# Patient Record
Sex: Male | Born: 1945 | Race: White | Hispanic: No | Marital: Married | Smoking: Never smoker
Health system: Southern US, Community
[De-identification: ages and names within clinical notes are randomized; demographics above are authoritative.]

## PROBLEM LIST (undated history)

## (undated) DIAGNOSIS — T4145XA Adverse effect of unspecified anesthetic, initial encounter: Secondary | ICD-10-CM

## (undated) DIAGNOSIS — E7800 Pure hypercholesterolemia, unspecified: Secondary | ICD-10-CM

## (undated) DIAGNOSIS — G20 Parkinson's disease: Secondary | ICD-10-CM

## (undated) DIAGNOSIS — K635 Polyp of colon: Secondary | ICD-10-CM

## (undated) DIAGNOSIS — R251 Tremor, unspecified: Secondary | ICD-10-CM

## (undated) DIAGNOSIS — L409 Psoriasis, unspecified: Secondary | ICD-10-CM

## (undated) DIAGNOSIS — F419 Anxiety disorder, unspecified: Secondary | ICD-10-CM

## (undated) DIAGNOSIS — G20A1 Parkinson's disease without dyskinesia, without mention of fluctuations: Secondary | ICD-10-CM

## (undated) DIAGNOSIS — H9190 Unspecified hearing loss, unspecified ear: Secondary | ICD-10-CM

## (undated) DIAGNOSIS — N400 Enlarged prostate without lower urinary tract symptoms: Secondary | ICD-10-CM

## (undated) DIAGNOSIS — N433 Hydrocele, unspecified: Secondary | ICD-10-CM

## (undated) DIAGNOSIS — G4752 REM sleep behavior disorder: Secondary | ICD-10-CM

## (undated) DIAGNOSIS — T8859XA Other complications of anesthesia, initial encounter: Secondary | ICD-10-CM

## (undated) DIAGNOSIS — R269 Unspecified abnormalities of gait and mobility: Secondary | ICD-10-CM

## (undated) HISTORY — DX: Parkinson's disease: G20

## (undated) HISTORY — DX: REM sleep behavior disorder: G47.52

## (undated) HISTORY — DX: Benign prostatic hyperplasia without lower urinary tract symptoms: N40.0

## (undated) HISTORY — DX: Unspecified abnormalities of gait and mobility: R26.9

## (undated) HISTORY — DX: Parkinson's disease without dyskinesia, without mention of fluctuations: G20.A1

## (undated) HISTORY — PX: FOOT SURGERY: SHX648

## (undated) HISTORY — DX: Tremor, unspecified: R25.1

## (undated) HISTORY — DX: Anxiety disorder, unspecified: F41.9

## (undated) HISTORY — PX: EXTERNAL EAR SURGERY: SHX627

## (undated) HISTORY — DX: Pure hypercholesterolemia, unspecified: E78.00

## (undated) HISTORY — DX: Polyp of colon: K63.5

---

## 1965-07-04 HISTORY — PX: OTHER SURGICAL HISTORY: SHX169

## 1976-07-04 HISTORY — PX: MIDDLE EAR SURGERY: SHX713

## 1976-07-04 HISTORY — PX: OTHER SURGICAL HISTORY: SHX169

## 1977-07-04 HISTORY — PX: VASECTOMY: SHX75

## 1985-07-04 HISTORY — PX: APPENDECTOMY: SHX54

## 2003-08-27 ENCOUNTER — Encounter: Admission: RE | Admit: 2003-08-27 | Discharge: 2003-08-27 | Payer: Self-pay | Admitting: Sports Medicine

## 2004-09-01 ENCOUNTER — Encounter (INDEPENDENT_AMBULATORY_CARE_PROVIDER_SITE_OTHER): Payer: Self-pay | Admitting: Specialist

## 2004-09-01 ENCOUNTER — Ambulatory Visit (HOSPITAL_COMMUNITY): Admission: RE | Admit: 2004-09-01 | Discharge: 2004-09-01 | Payer: Self-pay | Admitting: Gastroenterology

## 2004-11-22 ENCOUNTER — Encounter: Admission: RE | Admit: 2004-11-22 | Discharge: 2004-12-07 | Payer: Self-pay | Admitting: Family Medicine

## 2007-03-09 ENCOUNTER — Encounter (INDEPENDENT_AMBULATORY_CARE_PROVIDER_SITE_OTHER): Payer: Self-pay | Admitting: Internal Medicine

## 2007-03-09 ENCOUNTER — Ambulatory Visit: Payer: Self-pay | Admitting: Vascular Surgery

## 2007-03-09 ENCOUNTER — Observation Stay (HOSPITAL_COMMUNITY): Admission: EM | Admit: 2007-03-09 | Discharge: 2007-03-10 | Payer: Self-pay | Admitting: Emergency Medicine

## 2010-04-06 ENCOUNTER — Encounter: Admission: RE | Admit: 2010-04-06 | Discharge: 2010-04-06 | Payer: Self-pay | Admitting: Otolaryngology

## 2010-11-16 NOTE — Discharge Summary (Signed)
NAMEDIXIE, JAFRI              ACCOUNT NO.:  [id number]   MEDICAL RECORD NO.:  [id number]          PATIENT TYPE:  INP   LOCATION:  3020                         FACILITY:  [facility]   PHYSICIAN:  Michelene Gardener, MD    DATE OF BIRTH:  1946-04-19   DATE OF ADMISSION:  03/09/2007  DATE OF DISCHARGE:  03/10/2007                               DISCHARGE SUMMARY   DISCHARGE DIAGNOSIS:  1. Left-sided numbness, questionable for transient ischemic attack.  2. Benign prostatic hypertrophy.  3. Hypertension.   DISCHARGE MEDICATIONS:  1. Cardura 4 mg p.o. once daily.  2. Aspirin 81 mg p.o. once daily.   CONSULTATIONS:  None.   PROCEDURE:  None.   FOLLOWUP:  Follow-up appointment with Dr. Miguel Aschoff in 1-2 weeks.   RADIOLOGY STUDIES:  1. CT scan of the head without contrast done on September 5 showed no      evidence of acute problem.  2. MRI of the brain done on September 5 showed no acute findings.  3. MRA of the brain without contrast done on September 6 showed no      acute findings.  4. Bilateral carotid Dopplers showed no evidence of significant      stenosis.  5. Echocardiogram showed no acute findings.  6. Chest x-ray was showed no active disease.   COURSE OF HOSPITALIZATION:  This is a 65 year old male with past medical  history of hypertension, which is diet controlled, who presented to the  hospital complaining of left-sided numbness that has been going on for a  few hours prior to his presentation.  The patient's neurological  examination has been normal except with mild decreased sensation in the  left side.  CT scan of his head was done and came to be normal.  MRI of  his brain was done and came to be normal.  Echocardiogram was done and  showed no acute findings.  Carotid Doppler was done and showed no  significant stenosis.  The patient was monitored overnight in telemetry,  and no acute arrhythmias were detected.  On the day of discharge, he is  still having mild  numbness in his left side, which has improved a lot  since he came in.  The patient was started on aspirin 81 mg p.o. once  daily.   IMPRESSION:  My own impression, I do not think this is neurological, and  it might all be related to possible conversion disorder.  This patient  has been under a lot of stress.  I had told him to watch his  neurological symptoms including any increasing weakness or if his  numbness increased, to watch his vision and his speech, and to check  with his primary doctor as soon as possible to make sure that the  symptoms are resolving.  The  patient understand the plan, and he  agrees.   ASSESSMENT TIME:  40 minutes.      Michelene Gardener, MD  Electronically Signed     NAE/MEDQ  D:  03/10/2007  T:  03/10/2007  Job:  [id number]   cc:   C. Hessie Diener  Tenny Craw, M.D.

## 2010-11-16 NOTE — H&P (Signed)
NAMESANTO, ZAHRADNIK NO.:  [id number]   MEDICAL RECORD NO.:  [id number]          PATIENT TYPE:  EMS   LOCATION:  MAJO                         FACILITY:  [facility]   PHYSICIAN:  Michelene Gardener, MD    DATE OF BIRTH:  [birth year]   DATE OF ADMISSION:  03/09/2007  DATE OF DISCHARGE:                              HISTORY & PHYSICAL   PRIMARY CARE PHYSICIAN:  C. Duane Lope, M.D.   CHIEF COMPLAINT:  Increasing left-sided numbness started this morning.   HISTORY OF PRESENT ILLNESS:  This is a 65 year old Caucasian male with  past medical history of hypertension and prostatic hypertrophy who  presented with the above mentioned complaints.  Symptoms started at 2  a.m. with left arm aching and sensation and numbness.  Then he developed  the same sensation in the left side of his face and now is increasing to  his left leg.  He came into the ER for further evaluation.  His vitals  in the ER showed temperature of 97.5, blood pressure 141/91, pulse 115,  respiratory rate 22.  CT scan of the head was done and it came to be  normal.  Hospitalists service was called for further evaluation.   PAST MEDICAL HISTORY:  1. Benign prostatic hypertrophy.  2. Hypertension.   PAST SURGICAL HISTORY:  Denied.   ALLERGIES:  DEMEROL which caused a rash and localized swelling.   CURRENT MEDICATIONS:  Cardura 4 mg p.o. once daily.   SOCIAL HISTORY:  Denies smoking, denies alcohol drinking, and denies  recreational drugs.   FAMILY HISTORY:  Significant for brother with coronary artery disease.   REVIEW OF SYSTEMS:  As per history of present illness.   PHYSICAL EXAMINATION:  VITAL SIGNS:  Temperature 97.7, blood pressure  133/81, pulse 99, respiratory rate 18.  GENERAL:  This is a middle-aged Caucasian male in no acute distress.  HEENT:  Conjunctivae showed no pallor and no erythema.  Pupils equal,  round, and reactive to light and accommodation.  There is no ptosis.  Hearing is intact.   There is no ear discharge or infection.  There is no  nose infection or bleeding.  Oral mucosa is dry, no pharyngeal erythema.  NECK:  Supple, no JVD, no carotid bruit, no lymphadenopathy, no thyroid  enlargement or thyroid tenderness.  CARDIOVASCULAR:  S1 and S2 are regular.  There are no murmurs, no  gallops, and no thrills.  LUNGS:  The patient is breathing between 16 to 18.  No use of accessory  muscles, no intracostal retractors, no dullness, no wheezes, no rhonchi,  and no rales.  ABDOMEN:  Soft, nondistended.  No hepatosplenomegaly.  Bowel sounds are  normal.  EXTREMITIES:  Lower extremities; no edema, no rash, and no varicose  veins.  SKIN:  No rash and no erythema.  NEUROLOGY:  Cranial nerves seems to be effected where there is left  facial droop, but speech is not effected and other cranial nerves are  normal.  Strength is 5/5 in all four extremities.  Sensation showed  decreased sensation in the left side including left upper extremity,  left lower extremity, and left side of the face.  Gait is normal.   LABORATORY DATA:  Sodium 137, potassium 4.1, chloride 107, BUN 11,  creatinine 0.9, glucose 111, WBC 6.5, hemoglobin 15.1, hematocrit 44.2,  platelet count 250.  Urinalysis negative.   CT scan of the head showed no evidence of acute ischemia or bleeding and  there is no midline shift.   EKG is sinus tachycardia with no evidence of acute ischemia.   IMPRESSION:  1. Transient ischemic attack versus cerebrovascular accident.  We will      admit this patient to the telemetry floor.  CT scan of the head was      done and came to be normal.  I will start this patient on aspirin      325 mg once daily.  I will get MRI and MRA of his brain for further      evaluation.  I will get bilateral carotid Doppler and      echocardiogram.  I will also consult PT with his weakness on the      left side.  If abnormalities are noticed on his MRI, then we will      consider neurologic  examination.  2. Benign prostatic hypertrophy.  We will continue his Cardura.  3. Hypertension.  This is diet controlled and we will continue on 2      gram sodium.   Total assessment time is 1 hour.      Michelene Gardener, MD  Electronically Signed     NAE/MEDQ  D:  03/09/2007  T:  03/09/2007  Job:  [id number]   cc:   C. Duane Lope, M.D.

## 2010-11-19 NOTE — Op Note (Signed)
Tim [name], Tim [name]              ACCOUNT NO.:  [id number]   MEDICAL RECORD NO.:  [id number]          PATIENT TYPE:  AMB   LOCATION:  ENDO                         FACILITY:  [facility]   PHYSICIAN:  John C. Madilyn Fireman, M.D.    DATE OF BIRTH:  1946-01-23   DATE OF PROCEDURE:  09/01/2004  DATE OF DISCHARGE:                                 OPERATIVE REPORT   INDICATIONS FOR PROCEDURE:  History of adenomatous colon polyps.   PROCEDURE:  The patient was placed in the left lateral decubitus position  and placed on the pulse monitor with continuous low-flow oxygen delivered by  nasal cannula. She was sedated with 100 mcg IV fentanyl and 10 mg IV Versed.  Olympus video colonoscope was inserted into the rectum and advanced to  cecum, confirmed by transillumination of McBurney's point and visualization  of ileocecal valve and appendiceal orifice. Prep was excellent. The cecum  appeared normal with no masses, polyps, diverticula or other mucosal  abnormalities. From the ascending colon there was an8-mm polyp that was  removed by hot biopsy. The remainder of the ascending, transverse,  descending, sigmoid and rectum appeared normal with no further polyps,  masses, diverticula or other mucosal abnormalities. The scope was then  withdrawn and the patient returned to the recovery room in stable condition.  He tolerated procedure well. There were no immediate complications.   IMPRESSION:  Descending colon polyps.   PLAN:  Await histology and will probably repeat colonoscopy in three to five  years.      JCH/MEDQ  D:  09/01/2004  T:  09/01/2004  Job:  [id number]   cc:   C. Duane Lope, M.D.  [address]  [place]  [place] [address]  Fax: [phone]

## 2010-11-22 ENCOUNTER — Other Ambulatory Visit: Payer: Self-pay | Admitting: Otolaryngology

## 2010-11-22 DIAGNOSIS — J329 Chronic sinusitis, unspecified: Secondary | ICD-10-CM

## 2010-11-23 ENCOUNTER — Ambulatory Visit
Admission: RE | Admit: 2010-11-23 | Discharge: 2010-11-23 | Disposition: A | Discharge status: Home / Self Care | Payer: Medicare Other | Source: Ambulatory Visit | Attending: Otolaryngology | Admitting: Otolaryngology

## 2010-11-23 DIAGNOSIS — J329 Chronic sinusitis, unspecified: Secondary | ICD-10-CM

## 2010-12-10 ENCOUNTER — Other Ambulatory Visit: Payer: Self-pay | Admitting: Family Medicine

## 2010-12-10 DIAGNOSIS — R634 Abnormal weight loss: Secondary | ICD-10-CM

## 2010-12-14 ENCOUNTER — Other Ambulatory Visit: Payer: Self-pay | Admitting: Family Medicine

## 2010-12-14 DIAGNOSIS — R634 Abnormal weight loss: Secondary | ICD-10-CM

## 2010-12-17 ENCOUNTER — Ambulatory Visit
Admission: RE | Admit: 2010-12-17 | Discharge: 2010-12-17 | Disposition: A | Discharge status: Home / Self Care | Payer: Medicare Other | Source: Ambulatory Visit | Attending: Family Medicine | Admitting: Family Medicine

## 2010-12-17 DIAGNOSIS — R634 Abnormal weight loss: Secondary | ICD-10-CM

## 2010-12-17 MED ORDER — IOHEXOL 300 MG/ML  SOLN
100.0000 mL | Freq: Once | INTRAMUSCULAR | Status: AC | PRN
  Administered 2010-12-17: 100 mL via INTRAVENOUS

## 2010-12-24 ENCOUNTER — Other Ambulatory Visit: Payer: Self-pay | Admitting: Gastroenterology

## 2010-12-24 DIAGNOSIS — R634 Abnormal weight loss: Secondary | ICD-10-CM

## 2010-12-28 ENCOUNTER — Ambulatory Visit
Admission: RE | Admit: 2010-12-28 | Discharge: 2010-12-28 | Disposition: A | Discharge status: Home / Self Care | Payer: Medicare Other | Source: Ambulatory Visit | Attending: Gastroenterology | Admitting: Gastroenterology

## 2010-12-28 DIAGNOSIS — R634 Abnormal weight loss: Secondary | ICD-10-CM

## 2011-04-15 LAB — CBC
HCT: 44.2
Platelets: 250
RDW: 14.1 — ABNORMAL HIGH
WBC: 6.5

## 2011-04-15 LAB — DIFFERENTIAL
Basophils Absolute: 0.1
Lymphocytes Relative: 24
Lymphs Abs: 1.5
Neutro Abs: 4.3
Neutrophils Relative %: 65

## 2011-04-15 LAB — POCT I-STAT CREATININE
Creatinine, Ser: 0.9
Operator id: 288831

## 2011-04-15 LAB — URINALYSIS, ROUTINE W REFLEX MICROSCOPIC
Bilirubin Urine: NEGATIVE
Glucose, UA: NEGATIVE
Ketones, ur: 40 — AB
Nitrite: NEGATIVE
Specific Gravity, Urine: 1.02
pH: 7.5

## 2011-04-15 LAB — I-STAT 8, (EC8 V) (CONVERTED LAB)
Acid-Base Excess: 3 — ABNORMAL HIGH
Glucose, Bld: 111 — ABNORMAL HIGH
TCO2: 26
pCO2, Ven: 30.8 — ABNORMAL LOW
pH, Ven: 7.521 — ABNORMAL HIGH

## 2011-04-15 LAB — POCT CARDIAC MARKERS
CKMB, poc: <1 — ABNORMAL LOW
Myoglobin, poc: 60.5
Troponin i, poc: <0.05

## 2011-04-15 LAB — PROTIME-INR
INR: 0.9
Prothrombin Time: 12.5

## 2011-04-15 LAB — APTT: aPTT: 25

## 2011-04-21 ENCOUNTER — Other Ambulatory Visit: Payer: Self-pay | Admitting: Gastroenterology

## 2011-07-13 DIAGNOSIS — R634 Abnormal weight loss: Secondary | ICD-10-CM | POA: Diagnosis not present

## 2011-07-13 DIAGNOSIS — R259 Unspecified abnormal involuntary movements: Secondary | ICD-10-CM | POA: Diagnosis not present

## 2011-08-04 DIAGNOSIS — N289 Disorder of kidney and ureter, unspecified: Secondary | ICD-10-CM | POA: Diagnosis not present

## 2011-08-04 DIAGNOSIS — N35919 Unspecified urethral stricture, male, unspecified site: Secondary | ICD-10-CM | POA: Diagnosis not present

## 2011-08-08 DIAGNOSIS — K645 Perianal venous thrombosis: Secondary | ICD-10-CM | POA: Diagnosis not present

## 2011-08-10 DIAGNOSIS — G20 Parkinson's disease: Secondary | ICD-10-CM | POA: Diagnosis not present

## 2011-08-11 DIAGNOSIS — G20 Parkinson's disease: Secondary | ICD-10-CM | POA: Diagnosis not present

## 2011-08-17 DIAGNOSIS — H6980 Other specified disorders of Eustachian tube, unspecified ear: Secondary | ICD-10-CM | POA: Diagnosis not present

## 2011-08-17 DIAGNOSIS — H903 Sensorineural hearing loss, bilateral: Secondary | ICD-10-CM | POA: Diagnosis not present

## 2011-08-17 DIAGNOSIS — G20 Parkinson's disease: Secondary | ICD-10-CM | POA: Diagnosis not present

## 2011-09-01 ENCOUNTER — Ambulatory Visit: Payer: Medicare Other | Attending: Neurology | Admitting: Physical Therapy

## 2011-09-01 DIAGNOSIS — IMO0001 Reserved for inherently not codable concepts without codable children: Secondary | ICD-10-CM | POA: Diagnosis not present

## 2011-09-01 DIAGNOSIS — G20 Parkinson's disease: Secondary | ICD-10-CM | POA: Diagnosis not present

## 2011-09-01 DIAGNOSIS — G20A1 Parkinson's disease without dyskinesia, without mention of fluctuations: Secondary | ICD-10-CM | POA: Insufficient documentation

## 2011-09-01 DIAGNOSIS — R269 Unspecified abnormalities of gait and mobility: Secondary | ICD-10-CM | POA: Diagnosis not present

## 2011-09-06 ENCOUNTER — Ambulatory Visit: Payer: Medicare Other | Admitting: Physical Therapy

## 2011-09-06 ENCOUNTER — Ambulatory Visit: Payer: Medicare Other | Attending: Neurology | Admitting: Physical Therapy

## 2011-09-06 DIAGNOSIS — R471 Dysarthria and anarthria: Secondary | ICD-10-CM | POA: Diagnosis not present

## 2011-09-06 DIAGNOSIS — G20A1 Parkinson's disease without dyskinesia, without mention of fluctuations: Secondary | ICD-10-CM | POA: Insufficient documentation

## 2011-09-06 DIAGNOSIS — G20 Parkinson's disease: Secondary | ICD-10-CM | POA: Diagnosis not present

## 2011-09-06 DIAGNOSIS — Z5189 Encounter for other specified aftercare: Secondary | ICD-10-CM | POA: Diagnosis not present

## 2011-09-06 DIAGNOSIS — R269 Unspecified abnormalities of gait and mobility: Secondary | ICD-10-CM | POA: Diagnosis not present

## 2011-09-07 ENCOUNTER — Ambulatory Visit: Payer: Medicare Other | Admitting: Physical Therapy

## 2011-09-07 ENCOUNTER — Ambulatory Visit: Payer: Medicare Other

## 2011-09-07 DIAGNOSIS — R471 Dysarthria and anarthria: Secondary | ICD-10-CM | POA: Diagnosis not present

## 2011-09-07 DIAGNOSIS — Z5189 Encounter for other specified aftercare: Secondary | ICD-10-CM | POA: Diagnosis not present

## 2011-09-07 DIAGNOSIS — R269 Unspecified abnormalities of gait and mobility: Secondary | ICD-10-CM | POA: Diagnosis not present

## 2011-09-07 DIAGNOSIS — G20 Parkinson's disease: Secondary | ICD-10-CM | POA: Diagnosis not present

## 2011-09-12 ENCOUNTER — Ambulatory Visit: Payer: Medicare Other | Admitting: Physical Therapy

## 2011-09-12 ENCOUNTER — Ambulatory Visit: Payer: Medicare Other

## 2011-09-12 DIAGNOSIS — Z5189 Encounter for other specified aftercare: Secondary | ICD-10-CM | POA: Diagnosis not present

## 2011-09-12 DIAGNOSIS — R471 Dysarthria and anarthria: Secondary | ICD-10-CM | POA: Diagnosis not present

## 2011-09-12 DIAGNOSIS — R269 Unspecified abnormalities of gait and mobility: Secondary | ICD-10-CM | POA: Diagnosis not present

## 2011-09-12 DIAGNOSIS — G20 Parkinson's disease: Secondary | ICD-10-CM | POA: Diagnosis not present

## 2011-09-14 ENCOUNTER — Ambulatory Visit: Payer: Medicare Other

## 2011-09-14 ENCOUNTER — Ambulatory Visit: Payer: Medicare Other | Admitting: Rehabilitative and Restorative Service Providers"

## 2011-09-14 DIAGNOSIS — Z5189 Encounter for other specified aftercare: Secondary | ICD-10-CM | POA: Diagnosis not present

## 2011-09-14 DIAGNOSIS — G20 Parkinson's disease: Secondary | ICD-10-CM | POA: Diagnosis not present

## 2011-09-14 DIAGNOSIS — R269 Unspecified abnormalities of gait and mobility: Secondary | ICD-10-CM | POA: Diagnosis not present

## 2011-09-14 DIAGNOSIS — R471 Dysarthria and anarthria: Secondary | ICD-10-CM | POA: Diagnosis not present

## 2011-09-20 ENCOUNTER — Ambulatory Visit: Payer: Medicare Other | Admitting: Physical Therapy

## 2011-09-21 ENCOUNTER — Encounter: Payer: Medicare Other | Admitting: Speech Pathology

## 2011-09-21 ENCOUNTER — Ambulatory Visit: Payer: Medicare Other | Admitting: Physical Therapy

## 2011-09-23 ENCOUNTER — Ambulatory Visit: Payer: Medicare Other

## 2011-09-23 ENCOUNTER — Ambulatory Visit: Payer: Medicare Other | Admitting: Physical Therapy

## 2011-09-23 DIAGNOSIS — G20 Parkinson's disease: Secondary | ICD-10-CM | POA: Diagnosis not present

## 2011-09-23 DIAGNOSIS — R471 Dysarthria and anarthria: Secondary | ICD-10-CM | POA: Diagnosis not present

## 2011-09-23 DIAGNOSIS — Z5189 Encounter for other specified aftercare: Secondary | ICD-10-CM | POA: Diagnosis not present

## 2011-09-23 DIAGNOSIS — R269 Unspecified abnormalities of gait and mobility: Secondary | ICD-10-CM | POA: Diagnosis not present

## 2011-09-26 ENCOUNTER — Ambulatory Visit: Payer: Medicare Other | Admitting: Physical Therapy

## 2011-09-26 ENCOUNTER — Encounter: Payer: Medicare Other | Admitting: Speech Pathology

## 2011-09-26 DIAGNOSIS — R471 Dysarthria and anarthria: Secondary | ICD-10-CM | POA: Diagnosis not present

## 2011-09-26 DIAGNOSIS — G20 Parkinson's disease: Secondary | ICD-10-CM | POA: Diagnosis not present

## 2011-09-26 DIAGNOSIS — R269 Unspecified abnormalities of gait and mobility: Secondary | ICD-10-CM | POA: Diagnosis not present

## 2011-09-26 DIAGNOSIS — Z5189 Encounter for other specified aftercare: Secondary | ICD-10-CM | POA: Diagnosis not present

## 2011-09-28 ENCOUNTER — Ambulatory Visit: Payer: Medicare Other | Admitting: Physical Therapy

## 2011-10-03 ENCOUNTER — Ambulatory Visit: Payer: Medicare Other | Admitting: Physical Therapy

## 2011-10-05 ENCOUNTER — Ambulatory Visit: Payer: Medicare Other | Admitting: Physical Therapy

## 2011-10-13 DIAGNOSIS — R634 Abnormal weight loss: Secondary | ICD-10-CM | POA: Diagnosis not present

## 2011-10-26 DIAGNOSIS — G20 Parkinson's disease: Secondary | ICD-10-CM | POA: Diagnosis not present

## 2011-11-11 DIAGNOSIS — G20 Parkinson's disease: Secondary | ICD-10-CM | POA: Diagnosis not present

## 2012-01-03 DIAGNOSIS — G20 Parkinson's disease: Secondary | ICD-10-CM | POA: Diagnosis not present

## 2012-01-03 DIAGNOSIS — H6980 Other specified disorders of Eustachian tube, unspecified ear: Secondary | ICD-10-CM | POA: Diagnosis not present

## 2012-01-03 DIAGNOSIS — H903 Sensorineural hearing loss, bilateral: Secondary | ICD-10-CM | POA: Diagnosis not present

## 2012-01-03 DIAGNOSIS — H7310 Chronic myringitis, unspecified ear: Secondary | ICD-10-CM | POA: Diagnosis not present

## 2012-01-24 DIAGNOSIS — H903 Sensorineural hearing loss, bilateral: Secondary | ICD-10-CM | POA: Diagnosis not present

## 2012-01-24 DIAGNOSIS — H6980 Other specified disorders of Eustachian tube, unspecified ear: Secondary | ICD-10-CM | POA: Diagnosis not present

## 2012-01-24 DIAGNOSIS — H7310 Chronic myringitis, unspecified ear: Secondary | ICD-10-CM | POA: Diagnosis not present

## 2012-01-24 DIAGNOSIS — G20 Parkinson's disease: Secondary | ICD-10-CM | POA: Diagnosis not present

## 2012-02-02 DIAGNOSIS — G20 Parkinson's disease: Secondary | ICD-10-CM | POA: Diagnosis not present

## 2012-05-01 DIAGNOSIS — M25569 Pain in unspecified knee: Secondary | ICD-10-CM | POA: Diagnosis not present

## 2012-05-01 DIAGNOSIS — Z23 Encounter for immunization: Secondary | ICD-10-CM | POA: Diagnosis not present

## 2012-05-01 DIAGNOSIS — Z79899 Other long term (current) drug therapy: Secondary | ICD-10-CM | POA: Diagnosis not present

## 2012-05-01 DIAGNOSIS — E7800 Pure hypercholesterolemia, unspecified: Secondary | ICD-10-CM | POA: Diagnosis not present

## 2012-05-01 DIAGNOSIS — F411 Generalized anxiety disorder: Secondary | ICD-10-CM | POA: Diagnosis not present

## 2012-05-01 DIAGNOSIS — Z125 Encounter for screening for malignant neoplasm of prostate: Secondary | ICD-10-CM | POA: Diagnosis not present

## 2012-05-01 DIAGNOSIS — Z0000 Encounter for general adult medical examination without abnormal findings: Secondary | ICD-10-CM | POA: Diagnosis not present

## 2012-05-09 DIAGNOSIS — G20 Parkinson's disease: Secondary | ICD-10-CM | POA: Diagnosis not present

## 2012-05-09 DIAGNOSIS — H903 Sensorineural hearing loss, bilateral: Secondary | ICD-10-CM | POA: Diagnosis not present

## 2012-05-09 DIAGNOSIS — H6980 Other specified disorders of Eustachian tube, unspecified ear: Secondary | ICD-10-CM | POA: Diagnosis not present

## 2012-05-14 ENCOUNTER — Other Ambulatory Visit: Payer: Self-pay | Admitting: Family Medicine

## 2012-05-14 DIAGNOSIS — D235 Other benign neoplasm of skin of trunk: Secondary | ICD-10-CM | POA: Diagnosis not present

## 2012-05-14 DIAGNOSIS — C4491 Basal cell carcinoma of skin, unspecified: Secondary | ICD-10-CM | POA: Diagnosis not present

## 2012-05-14 DIAGNOSIS — L820 Inflamed seborrheic keratosis: Secondary | ICD-10-CM | POA: Diagnosis not present

## 2012-05-14 DIAGNOSIS — C44519 Basal cell carcinoma of skin of other part of trunk: Secondary | ICD-10-CM | POA: Diagnosis not present

## 2012-05-14 DIAGNOSIS — D485 Neoplasm of uncertain behavior of skin: Secondary | ICD-10-CM | POA: Diagnosis not present

## 2012-06-06 ENCOUNTER — Other Ambulatory Visit: Payer: Self-pay | Admitting: Family Medicine

## 2012-06-06 DIAGNOSIS — C4491 Basal cell carcinoma of skin, unspecified: Secondary | ICD-10-CM | POA: Diagnosis not present

## 2012-06-06 DIAGNOSIS — C44519 Basal cell carcinoma of skin of other part of trunk: Secondary | ICD-10-CM | POA: Diagnosis not present

## 2012-06-13 DIAGNOSIS — M2610 Unspecified anomaly of jaw-cranial base relationship: Secondary | ICD-10-CM | POA: Diagnosis not present

## 2012-06-13 DIAGNOSIS — G20 Parkinson's disease: Secondary | ICD-10-CM | POA: Diagnosis not present

## 2012-06-13 DIAGNOSIS — R0609 Other forms of dyspnea: Secondary | ICD-10-CM | POA: Diagnosis not present

## 2012-06-13 DIAGNOSIS — G4710 Hypersomnia, unspecified: Secondary | ICD-10-CM | POA: Diagnosis not present

## 2012-07-16 DIAGNOSIS — H6980 Other specified disorders of Eustachian tube, unspecified ear: Secondary | ICD-10-CM | POA: Diagnosis not present

## 2012-07-16 DIAGNOSIS — S01309A Unspecified open wound of unspecified ear, initial encounter: Secondary | ICD-10-CM | POA: Diagnosis not present

## 2012-07-16 DIAGNOSIS — H9210 Otorrhea, unspecified ear: Secondary | ICD-10-CM | POA: Diagnosis not present

## 2012-07-16 DIAGNOSIS — H60339 Swimmer's ear, unspecified ear: Secondary | ICD-10-CM | POA: Diagnosis not present

## 2012-07-16 DIAGNOSIS — G20 Parkinson's disease: Secondary | ICD-10-CM | POA: Diagnosis not present

## 2012-07-16 DIAGNOSIS — H7310 Chronic myringitis, unspecified ear: Secondary | ICD-10-CM | POA: Diagnosis not present

## 2012-07-30 DIAGNOSIS — G20 Parkinson's disease: Secondary | ICD-10-CM | POA: Diagnosis not present

## 2012-07-30 DIAGNOSIS — J069 Acute upper respiratory infection, unspecified: Secondary | ICD-10-CM | POA: Diagnosis not present

## 2012-07-30 DIAGNOSIS — H903 Sensorineural hearing loss, bilateral: Secondary | ICD-10-CM | POA: Diagnosis not present

## 2012-07-30 DIAGNOSIS — H6980 Other specified disorders of Eustachian tube, unspecified ear: Secondary | ICD-10-CM | POA: Diagnosis not present

## 2012-08-03 DIAGNOSIS — N289 Disorder of kidney and ureter, unspecified: Secondary | ICD-10-CM | POA: Diagnosis not present

## 2012-08-03 DIAGNOSIS — N35919 Unspecified urethral stricture, male, unspecified site: Secondary | ICD-10-CM | POA: Diagnosis not present

## 2012-08-06 DIAGNOSIS — G4710 Hypersomnia, unspecified: Secondary | ICD-10-CM | POA: Diagnosis not present

## 2012-08-06 DIAGNOSIS — G4752 REM sleep behavior disorder: Secondary | ICD-10-CM | POA: Diagnosis not present

## 2012-09-27 DIAGNOSIS — M6740 Ganglion, unspecified site: Secondary | ICD-10-CM | POA: Diagnosis not present

## 2012-11-05 ENCOUNTER — Ambulatory Visit (INDEPENDENT_AMBULATORY_CARE_PROVIDER_SITE_OTHER): Payer: Medicare Other | Admitting: Neurology

## 2012-11-05 ENCOUNTER — Encounter: Payer: Self-pay | Admitting: Neurology

## 2012-11-05 VITALS — BP 103/66 | HR 82 | Temp 98.4°F | Ht 69.0 in | Wt 155.0 lb

## 2012-11-05 DIAGNOSIS — G20A1 Parkinson's disease without dyskinesia, without mention of fluctuations: Secondary | ICD-10-CM

## 2012-11-05 DIAGNOSIS — G20 Parkinson's disease: Secondary | ICD-10-CM

## 2012-11-05 MED ORDER — CARBIDOPA-LEVODOPA 25-100 MG PO TABS: 1.0000 | ORAL_TABLET | Freq: Three times a day (TID) | ORAL | Status: DC
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
## 2012-11-05 NOTE — Patient Instructions (Addendum)
Parkinson's Disease Parkinson's disease is a disorder of the central nervous system, which includes the brain and spinal cord. A person with this disease slowly loses the ability to completely control body movements. Within the brain, there is a group of nerve cells (basal ganglia) that help control movement. The basal ganglia are damaged and do not work properly in a person with Parkinson's disease. In addition, the basal ganglia produce and use a brain chemical called dopamine. The dopamine chemical sends messages to other parts of the body to control and coordinate body movements. Dopamine levels are low in a person with Parkinson's disease. If the dopamine levels are low, then the body does not receive the correct messages it needs to move normally.  CAUSES  The exact reason why the basal ganglia get damaged is not known. Some medical researchers have thought that infection, genes, environment, and certain medicines may contribute to the cause.  SYMPTOMS   An early symptom of Parkinson's disease is often an uncontrolled shaking (tremor) of the hands. The tremor will often disappear when the affected hand is consciously used.  As the disease progresses, walking, talking, getting out of a chair, and new movements become more difficult.  Muscles get stiff and movements become slower.  Balance and coordination become harder.  Depression, trouble swallowing, urinary problems, constipation, and sleep problems can occur.  Later in the disease, memory and thought processes may deteriorate. DIAGNOSIS  There are no specific tests to diagnose Parkinson's disease. You may be referred to a neurologist for evaluation. Your caregiver will ask about your medical history, symptoms, and perform a physical exam. Blood tests and imaging tests of your brain may be performed to rule out other diseases. The imaging tests may include an MRI or a CT scan. TREATMENT  The goal of treatment is to relieve symptoms.  Medicines may be prescribed once the symptoms become troublesome. Medicine will not stop the progression of the disease, but medicine can make movement and balance better and help control tremors. Speech and occupational therapy may also be prescribed. Sometimes, surgical treatment of the brain can be done in young people. HOME CARE INSTRUCTIONS  Get regular exercise and rest periods during the day to help prevent exhaustion and depression.  If getting dressed becomes difficult, replace buttons and zippers with Velcro and elastic on your clothing.  Take all medicine as directed by your caregiver.  Install grab bars or railings in your home to prevent falls.  Go to speech or occupational therapy as directed.  Keep all follow-up visits as directed by your caregiver. SEEK MEDICAL CARE IF:  Your symptoms are not controlled with your medicine.  You fall.  You have trouble swallowing or choke on your food. MAKE SURE YOU:  Understand these instructions.  Will watch your condition.  Will get help right away if you are not doing well or get worse. Document Released: 06/17/2000 Document Revised: 12/20/2011 Document Reviewed: 07/20/2011 [facility] Patient Information 2013 [place], [place].  The patient's regular attendance at a local gym and fitness center twice a week will help to stall  the progression of Parkinson's disease. I would like him on those days that he cannot go there to have an alternative exercise regimen in the home  for 20 minutes a day. He had been asset by outpatient rehab for [facility] in 2013.

## 2012-11-14 DIAGNOSIS — H903 Sensorineural hearing loss, bilateral: Secondary | ICD-10-CM | POA: Diagnosis not present

## 2012-11-14 DIAGNOSIS — H6980 Other specified disorders of Eustachian tube, unspecified ear: Secondary | ICD-10-CM | POA: Diagnosis not present

## 2012-11-14 DIAGNOSIS — G20 Parkinson's disease: Secondary | ICD-10-CM | POA: Diagnosis not present

## 2013-01-25 ENCOUNTER — Other Ambulatory Visit: Payer: Self-pay | Admitting: Neurology

## 2013-02-01 DIAGNOSIS — H18419 Arcus senilis, unspecified eye: Secondary | ICD-10-CM | POA: Diagnosis not present

## 2013-02-01 DIAGNOSIS — H02839 Dermatochalasis of unspecified eye, unspecified eyelid: Secondary | ICD-10-CM | POA: Diagnosis not present

## 2013-02-01 DIAGNOSIS — H2510 Age-related nuclear cataract, unspecified eye: Secondary | ICD-10-CM | POA: Diagnosis not present

## 2013-02-01 DIAGNOSIS — H25019 Cortical age-related cataract, unspecified eye: Secondary | ICD-10-CM | POA: Diagnosis not present

## 2013-03-12 DIAGNOSIS — M542 Cervicalgia: Secondary | ICD-10-CM | POA: Diagnosis not present

## 2013-03-12 DIAGNOSIS — Z23 Encounter for immunization: Secondary | ICD-10-CM | POA: Diagnosis not present

## 2013-03-15 DIAGNOSIS — H2510 Age-related nuclear cataract, unspecified eye: Secondary | ICD-10-CM | POA: Diagnosis not present

## 2013-03-15 DIAGNOSIS — H269 Unspecified cataract: Secondary | ICD-10-CM | POA: Diagnosis not present

## 2013-04-01 DIAGNOSIS — H269 Unspecified cataract: Secondary | ICD-10-CM | POA: Diagnosis not present

## 2013-04-01 DIAGNOSIS — H2510 Age-related nuclear cataract, unspecified eye: Secondary | ICD-10-CM | POA: Diagnosis not present

## 2013-04-09 ENCOUNTER — Encounter: Payer: Self-pay | Admitting: Neurology

## 2013-04-09 ENCOUNTER — Ambulatory Visit (INDEPENDENT_AMBULATORY_CARE_PROVIDER_SITE_OTHER): Payer: Medicare Other | Admitting: Neurology

## 2013-04-09 VITALS — BP 104/65 | HR 77 | Resp 16 | Ht 65.0 in | Wt 159.0 lb

## 2013-04-09 DIAGNOSIS — G20 Parkinson's disease: Secondary | ICD-10-CM | POA: Diagnosis not present

## 2013-04-09 DIAGNOSIS — G20A1 Parkinson's disease without dyskinesia, without mention of fluctuations: Secondary | ICD-10-CM

## 2013-04-09 NOTE — Patient Instructions (Signed)
Parkinson's Disease Parkinson's disease is a disorder of the brain and spinal cord (central nervous system). The person will slowly lose the ability to control his or her body movements. This happens due to:  Damaged nerve cells.  Low levels of a certain brain chemical. HOME CARE  Exercise often.  Make time to rest during the day.  Take all medicine as told by your doctor.  Replace buttons and zippers with elastic and Velcro if getting dressed is difficult.  Put grab bars or rails in your home. This helps you to not fall.  Go to speech therapy or therapy to help you with daily activities (occupational therapy). Do this as told by your doctor.  Keep all doctor visits as told. GET HELP IF:  Your medicine does not help your symptoms.  You fall.  You have trouble swallowing or choke on your food. MAKE SURE YOU:  Understand these instructions.  Will watch your condition.  Will get help right away if you are not doing well or get worse. Document Released: 09/12/2011 Document Reviewed: 09/12/2011 [facility] Patient Information 2014 [place], [place].
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
## 2013-05-07 DIAGNOSIS — Z0000 Encounter for general adult medical examination without abnormal findings: Secondary | ICD-10-CM | POA: Diagnosis not present

## 2013-05-07 DIAGNOSIS — Z79899 Other long term (current) drug therapy: Secondary | ICD-10-CM | POA: Diagnosis not present

## 2013-05-07 DIAGNOSIS — E7800 Pure hypercholesterolemia, unspecified: Secondary | ICD-10-CM | POA: Diagnosis not present

## 2013-05-07 DIAGNOSIS — G20 Parkinson's disease: Secondary | ICD-10-CM | POA: Diagnosis not present

## 2013-05-07 DIAGNOSIS — R3915 Urgency of urination: Secondary | ICD-10-CM | POA: Diagnosis not present

## 2013-05-07 DIAGNOSIS — M47812 Spondylosis without myelopathy or radiculopathy, cervical region: Secondary | ICD-10-CM | POA: Diagnosis not present

## 2013-05-07 DIAGNOSIS — F411 Generalized anxiety disorder: Secondary | ICD-10-CM | POA: Diagnosis not present

## 2013-07-04 HISTORY — PX: EYE SURGERY: SHX253

## 2013-07-16 DIAGNOSIS — Z125 Encounter for screening for malignant neoplasm of prostate: Secondary | ICD-10-CM | POA: Diagnosis not present

## 2013-07-16 DIAGNOSIS — N433 Hydrocele, unspecified: Secondary | ICD-10-CM | POA: Diagnosis not present

## 2013-07-16 DIAGNOSIS — N529 Male erectile dysfunction, unspecified: Secondary | ICD-10-CM | POA: Diagnosis not present

## 2013-07-23 DIAGNOSIS — Z23 Encounter for immunization: Secondary | ICD-10-CM | POA: Diagnosis not present

## 2013-07-23 DIAGNOSIS — J329 Chronic sinusitis, unspecified: Secondary | ICD-10-CM | POA: Diagnosis not present

## 2013-08-14 DIAGNOSIS — G20 Parkinson's disease: Secondary | ICD-10-CM | POA: Diagnosis not present

## 2013-08-14 DIAGNOSIS — H6980 Other specified disorders of Eustachian tube, unspecified ear: Secondary | ICD-10-CM | POA: Diagnosis not present

## 2013-08-14 DIAGNOSIS — H903 Sensorineural hearing loss, bilateral: Secondary | ICD-10-CM | POA: Diagnosis not present

## 2013-09-13 ENCOUNTER — Ambulatory Visit: Payer: Medicare Other | Admitting: Nurse Practitioner

## 2013-09-26 ENCOUNTER — Encounter (INDEPENDENT_AMBULATORY_CARE_PROVIDER_SITE_OTHER): Payer: Self-pay

## 2013-09-26 ENCOUNTER — Ambulatory Visit (INDEPENDENT_AMBULATORY_CARE_PROVIDER_SITE_OTHER): Payer: Medicare Other | Admitting: Nurse Practitioner

## 2013-09-26 ENCOUNTER — Encounter: Payer: Self-pay | Admitting: Nurse Practitioner

## 2013-09-26 VITALS — BP 115/73 | HR 65 | Ht 65.0 in | Wt 158.0 lb

## 2013-09-26 DIAGNOSIS — G20 Parkinson's disease: Secondary | ICD-10-CM

## 2013-09-26 DIAGNOSIS — G20A1 Parkinson's disease without dyskinesia, without mention of fluctuations: Secondary | ICD-10-CM

## 2013-09-26 MED ORDER — SELEGILINE HCL 5 MG PO CAPS: ORAL_CAPSULE | ORAL | Status: DC

## 2013-09-26 MED ORDER — CARBIDOPA-LEVODOPA 25-100 MG PO TABS: 1.0000 | ORAL_TABLET | Freq: Three times a day (TID) | ORAL | Status: DC

## 2013-09-26 NOTE — Progress Notes (Signed)
PATIENT: Tim [name] DOB: 1946-03-30  REASON FOR VISIT: follow up for Parkinson's HISTORY FROM: patient  HISTORY OF PRESENT ILLNESS: 04/09/13 (CD): Tim [name] seen today in a followup visit the patient was originally evaluated in early 2013, when I diagnosed him with Parkinson's disease. At the time he primarily presented with a tremor, muscle rigidity, shuffling gait and a mask face. He also reported having lost his sense of smell, and at night had muscle cramps that Kept him from getting restorative sleep.  There was no return of REM behavior at night. The patient's voice has cut more raspy and hoarse over the course I have met this patient, he also had some significant weight loss over the last 24 months.  In addition to the Parkinson's disease related symptoms of gait instability, a resting tremor, regular he also has a history of high cholesterol, anxiety , benign prostate hyperplasia and colonic polyps.  Family history his mother is deceased at age 35 due to congestive heart failure the father died at 15 years of age to a heart attack and a sister died at 57 due to complications of lupus. The couple has one son, who has normal medical history. The patient has 2 other children, 2 daughters, Siblings ( 2 brothers) are healthy.  The patient's current medication is as listed Selegiline 5 mg Twice a day , Carbidopa Levodopa 25-100 mg , TiD tablets at 7, 12 and 18.00 hours by mouth.   He also takes BuSpar 50 mg once a day, a half tablets simvastatin 40 mg daily and Cardura 4 mg tablets once a day .   After our last encounter I had ordered a polysomnography was at age patient's study to evaluate a patient for possible sleep apnea. At that time the patient endorsed the sleepiness score of 13/24 points and the backs score at 12 points.  The sleep study took place on 2-03/20/2014. The patient had an apnea index of only 2.6 and an RDI of 4.2 AHI and RDI were supine position dependence.  Oxygen  was 88%, the EKG showed PVCs but no sustained arrhythmia. Overall there was no significant evidence of apnea there was a low RDI , PLM index . Symptoms of restless legs or restless limb movement at night were not clinical voiced .  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 He has been able to gain a little weight, sleeps well on Klonopin and had no recent falls.  Exercise was interrupted after his Bilateral cataract surgeries 29-Sept 2014.   UPDATE 09/26/13 (LL):  Tim [name] returns for follow up of Parkinson's Disease.  He reports that he is doing well, does not notice any wearing-off of medication between doses. He notices the medication does help him move better.  He endorses constipation but does not think he needs medication for it.  Tremor is well controlled.  Reports one instance of lightheadness, when he got up in the middle of the night.  He had a headache and went to the kitchen for an aspirin and fainted very briefly - no injury or mitigating symptoms other than headache.  REVIEW OF SYSTEMS: Full 14 system review of systems performed and notable only for:  Hearing loss, constipation, daytime sleepiness, snoring, lightheadedness   ALLERGIES: Allergies  Allergen Reactions  . Demerol [Meperidine]     HOME MEDICATIONS: Outpatient Prescriptions Prior to Visit  Medication Sig Dispense Refill  .  BESIVANCE 0.6 % SUSP       . busPIRone (BUSPAR) 15 MG tablet Take 15 mg by mouth daily. 1/2 tablet once daily      . doxazosin (CARDURA) 4 MG tablet Take 4 mg by mouth daily. At bedtime      . simvastatin (ZOCOR) 40 MG tablet Take 40 mg by mouth every evening. Once daily      . carbidopa-levodopa (SINEMET) 25-100 MG per tablet Take 1 tablet by mouth 3 (three) times daily. 7am, 12, 6pm  270 tablet  3  . selegiline (ELDEPRYL) 5 MG capsule TAKE 1 CAPSULE BY MOUTH TWICE DAILY WITH FIRST AND LAST DOSE  OF SINEMET.  60 capsule  6   No facility-administered medications prior to visit.     PHYSICAL EXAM  Filed Vitals:   09/26/13 1000  BP: 115/73  Pulse: 65  Height: '5\' 5"'  (1.651 m)  Weight: 158 lb (71.668 kg)   Body mass index is 26.29 kg/(m^2).  Generalized: Well developed, in no acute distress  Head: normocephalic and atraumatic. Oropharynx benign  Neck: Supple, no carotid bruits  Cardiac: Regular rate rhythm, no murmur  Musculoskeletal: No deformity   Neurological examination  Mentation: Alert oriented to time, place, history taking. Follows all commands, speech is fluent with mild dysphonia , but not aphasia.  Cranial nerve II-XII: Pupils were equal round reactive to light extraocular movements were full, visual field were full on confrontational test. Facial sensation intact to fine touch. Facial motor strength is asymmetric ( NEW ) with drooling on the weaker side the right lower face. Right eye is opened wider than the Tim - forehead movements are symmetric. Hearing was intact to finger rubbing bilaterally. Patient wears bilateral hearing aids. Uvula tongue midline. head turning and shoulder shrug and were normal and symmetric.Tongue protrusion into cheek strength was normal. Motor: The motor testing reveals 5 over 5 strength of all 4 extremities. Evidence of cogwheeling of the Tim biceps and on the Tim Wrist, but also over the right biceps .  Sensory: Sensory testing is intact to pinprick, soft touch, vibration sensation, and position sense on all 4 extremities. No evidence of extinction is noted.  Coordination: Rapid alternating movements in the fingers/hands are slower , right less than Tim ( dominant hand for this patient ).  Gait and station: Gait is normal. Tandem gait is slowed. Romberg is negative. No drift is seen.  Reflexes: Deep tendon reflexes are symmetric and normal bilaterally. Toes are downgoing bilaterally.   ASSESSMENT AND PLAN 68 y.o. year old male  has a  past medical history of Parkinson disease; REM behavioral disorder; Tremor; Gait disorder; High cholesterol; Anxiety; BPH (benign prostatic hyperplasia); and Colon polyp. 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  Keep on current medication and dosages. No dystonic features. Tremor and rigor with gait impairment. At the current time there is no treatment necessary for physiologic sleep disorders. Also the patient's study dated evidence of REM sleep associated increased muscle tone, he has not acted out dreams to his knowledge or his wife's. In addition there was no evidence of apnea to a clinically significant level. He did not loose oxygen saturation, nor did he have abnormal EKG findings. The we'll concentrate on the medication adjustment and for treatment of Parkinson's disease.   PLAN: Continue current doses of Sinemet and Selegiline. I have sent refills [facility]. If your constipation becomes more of a problem for you, we have medications that  may help.  Recommend that you get regular exercise and drink plenty of water during the day to stay hydrated. Follow up in 6 months, sooner as needed.   Meds ordered this encounter  Medications  . carbidopa-levodopa (SINEMET) 25-100 MG per tablet    Sig: Take 1 tablet by mouth 3 (three) times daily. 7am, 12, 6pm    Dispense:  270 tablet    Refill:  3    Order Specific Question:  Supervising Provider    Answer:  DOHMEIER, CARMEN [[id number]]  . selegiline (ELDEPRYL) 5 MG capsule    Sig: TAKE 1 CAPSULE BY MOUTH TWICE DAILY WITH FIRST AND LAST DOSE OF SINEMET.    Dispense:  60 capsule    Refill:  6    Order Specific Question:  Supervising Provider    Answer:  Brett Fairy, CARMEN [[id number]]   Return in about 6 months (around 03/29/2014).  Philmore Pali, MSN, NP-C 09/26/2013, 10:41 AM [facility] [address], [place], [place] [address] [phone]  Note: This document was prepared with digital  dictation and possible smart phrase technology. Any transcriptional errors that result from this process are unintentional.

## 2013-09-26 NOTE — Patient Instructions (Addendum)
Continue current doses of Sinemet and Selegine.  I have [facility].  If your constipation becomes more of a problem for you, we have medications that may help.  Recommend that you get regular exercise and drink plenty of water during the day to stay hydrated.  Follow up in 6 months, sooner as needed.   Parkinson Disease Parkinson disease is a disorder of the central nervous system, which includes the brain and spinal cord. A person with this disease slowly loses the ability to completely control body movements. Within the brain, there is a group of nerve cells (basal ganglia) that help control movement. The basal ganglia are damaged and do not work properly in a person with Parkinson disease. In addition, the basal ganglia produce and use a brain chemical called dopamine. The dopamine chemical sends messages to other parts of the body to control and coordinate body movements. Dopamine levels are low in a person with Parkinson disease. If the dopamine levels are low, then the body does not receive the correct messages it needs to move normally.  CAUSES  The exact reason why the basal ganglia get damaged is not known. Some medical researchers have thought that infection, genes, environment, and certain medicines may contribute to the cause.  SYMPTOMS   An early symptom of Parkinson disease is often an uncontrolled shaking (tremor) of the hands. The tremor will often disappear when the affected hand is consciously used.  As the disease progresses, walking, talking, getting out of a chair, and new movements become more difficult.  Muscles get stiff and movements become slower.  Balance and coordination become harder.  Depression, trouble swallowing, urinary problems, constipation, and sleep problems can occur.  Later in the disease, memory and thought processes may deteriorate. DIAGNOSIS  There are no specific tests to diagnose Parkinson disease. You may be  referred to a neurologist for evaluation. Your caregiver will ask about your medical history, symptoms, and perform a physical exam. Blood tests and imaging tests of your brain may be performed to rule out other diseases. The imaging tests may include an MRI or a CT scan. TREATMENT  The goal of treatment is to relieve symptoms. Medicines may be prescribed once the symptoms become troublesome. Medicine will not stop the progression of the disease, but medicine can make movement and balance better and help control tremors. Speech and occupational therapy may also be prescribed. Sometimes, surgical treatment of the brain can be done in young people. HOME CARE INSTRUCTIONS  Get regular exercise and rest periods during the day to help prevent exhaustion and depression.  If getting dressed becomes difficult, replace buttons and zippers with Velcro and elastic on your clothing.  Take all medicine as directed by your caregiver.  Install grab bars or railings in your home to prevent falls.  Go to speech or occupational therapy as directed.  Keep all follow-up visits as directed by your caregiver. SEEK MEDICAL CARE IF:  Your symptoms are not controlled with your medicine.  You fall.  You have trouble swallowing or choke on your food. MAKE SURE YOU:  Understand these instructions.  Will watch your condition.  Will get help right away if you are not doing well or get worse. Document Released: 06/17/2000 Document Revised: 10/15/2012 Document Reviewed: 07/20/2011 [facility] Patient Information 2014 [place], [place].

## 2013-09-30 ENCOUNTER — Other Ambulatory Visit: Payer: Self-pay

## 2013-09-30 ENCOUNTER — Telehealth: Payer: Self-pay | Admitting: Neurology

## 2013-09-30 DIAGNOSIS — G20 Parkinson's disease: Secondary | ICD-10-CM

## 2013-09-30 MED ORDER — SELEGILINE HCL 5 MG PO CAPS: ORAL_CAPSULE | ORAL | Status: DC

## 2013-09-30 NOTE — Telephone Encounter (Signed)
Pharmacy says patient requests 90 day Rx.

## 2013-09-30 NOTE — Telephone Encounter (Signed)
Patient needs to get tier reduction for medication Selegiline--please call [phone]--thank you.

## 2013-09-30 NOTE — Telephone Encounter (Signed)
II have provided all requested info to ins [facility]) to ask for a tier exception.  The decision will be up to them if they will approve this request and lower the co-pay.

## 2014-02-24 DIAGNOSIS — G20 Parkinson's disease: Secondary | ICD-10-CM | POA: Diagnosis not present

## 2014-02-24 DIAGNOSIS — H6980 Other specified disorders of Eustachian tube, unspecified ear: Secondary | ICD-10-CM | POA: Diagnosis not present

## 2014-02-24 DIAGNOSIS — H903 Sensorineural hearing loss, bilateral: Secondary | ICD-10-CM | POA: Diagnosis not present

## 2014-04-03 ENCOUNTER — Ambulatory Visit (INDEPENDENT_AMBULATORY_CARE_PROVIDER_SITE_OTHER): Payer: Medicare Other | Admitting: Neurology

## 2014-04-03 ENCOUNTER — Encounter: Payer: Self-pay | Admitting: Neurology

## 2014-04-03 VITALS — BP 116/74 | HR 68 | Temp 98.4°F | Resp 16 | Ht 69.25 in | Wt 158.5 lb

## 2014-04-03 DIAGNOSIS — K635 Polyp of colon: Secondary | ICD-10-CM

## 2014-04-03 DIAGNOSIS — G20 Parkinson's disease: Secondary | ICD-10-CM | POA: Diagnosis not present

## 2014-04-03 MED ORDER — CARBIDOPA-LEVODOPA 25-100 MG PO TABS: 1.0000 | ORAL_TABLET | Freq: Four times a day (QID) | ORAL | Status: DC

## 2014-04-03 NOTE — Patient Instructions (Signed)
Deep Brain Stimulation Deep brain stimulation is used to treat certain medical conditions that do not respond to conventional medical treatments. An electric current is sent to an area of the brain that is causing problems. A device that generates this electric current sends it in pulses to a specific area deep inside the brain. Deep brain stimulation has been used to treat several medical conditions. It was first used to treat pain when medicines and other traditional therapies did not work. More recently, it has been used to treat people with medicine-resistant movement disorders, including Parkinson's disease, essential tremor, epilepsy, rare inherited disorders of muscle movement (dystonias), cluster headaches, and multiple sclerosis. Deep brain stimulation is also being used to treat cases of obsessive-compulsive disorder and depression that fail to respond to conventional treatments, such as medicine and shock therapy (electroconvulsive therapy). Deep brain stimulation is a safe and convenient treatment. It can be used 24 hours per day. The intensity and frequency of the pulses can be adjusted. It can be easily discontinued at any time. Medicines and other treatments can be used at the same time as deep brain stimulation. Generally, deep brain stimulation can be used without causing permanent damage to brain tissue. The deep brain stimulation device has 3 parts:  A lead. This is a thin wire. It goes through a small opening in the skull. It delivers the electric pulse.  A power source. This is called the neurotransmitter. It is usually placed under the skin in the upper part of the chest, similar to how a heart pacemaker is inserted. It is powered by a long-lasting battery.  An extension. This is a wire that connects the lead to the power source. The extension is passed under the skin of the head and neck and down to the power source. The electric pulses are automatic. The timing is set before the  device is placed in the body. A computer can send a radio signal to the neurotransmitter if the frequency of the pulse needs to be adjusted. A hand-held controller can be used to turn the neurotransmitter off. This is done if side effects of treatment are bad or if battery power needs to be conserved. PROCEDURE The surgery to insert a deep brain stimulation device is done in 2 parts:  The lead is inserted:  Numbing medicine (local anesthetic) is injected into your scalp. You are awake but feel no pain.  A small hole is made in your skull.  A computer is used to make a map of your brain. This shows the part of the brain that needs to be treated.  Your caregiver will ask you various questions. This helps the surgeon find the most appropriate part of the brain to place the lead.  The neurotransmitter and extension wire are inserted:  Medicine to make you go to sleep (general anesthetic) is used for this part of the procedure.  A small opening is made in the skin behind your ear. The extension wire is inserted through this opening.  A second opening is made in the upper part of your chest. This is for the neurotransmitter. RISKS AND COMPLICATIONS The procedure to insert a deep brain stimulation device is generally safe. However, as with all surgical procedures, there are some risks. These risks may be greater for the following people:  People older than 70 years.  People with blood vessel disease.  People receiving antiplatelet or anticoagulant therapy.  People with unstable medical conditions, such as severe coronary artery disease, severe hypertension,  uncontrolled diabetes, previous stroke, heart failure, and severe dementia. If complications occur, they are usually temporary. They may include bleeding, leaking of fluid from around the brain, and infection. Possible side effects from insertion of the device include:  Temporary tingling in the face, arms, or legs.  Pain or swelling  in the areas where the wires are placed.  An allergic reaction to parts of the device.  Slight problems with vision or speech.  Slight problem with balance.  Slight loss of movement.  Some jolting or shocking sensations. HOME CARE INSTRUCTIONS  Understand how to operate the device. Be sure to ask whether it is okay to turn it off at night.  Ask if there are any times or places when the device should not be turned on. Electrical devices at home will not affect the device. This includes microwaves and computers.  Continue to take any medicine prescribed by your caregiver. Do not start taking any new medicine unless your caregiver says it is okay. This includes over-the-counter medicines.  Keep all follow-up appointments. The device will need to be checked periodically by your caregiver. Batteries usually last for 3 to 5 years. FOR MORE INFORMATION American Association [employer]: www.aans.org Document Released: 10/05/2010 Document Revised: 09/12/2011 Document Reviewed: 10/05/2010 [facility] Patient Information 2015 [place], [place]. This information is not intended to replace advice given to you by your health care provider. Make sure you discuss any questions you have with your health care provider.

## 2014-04-03 NOTE — Progress Notes (Signed)
PATIENT: Tim [name] DOB: 1945/11/11  REASON FOR VISIT: follow up for Parkinson's HISTORY FROM: patient  HISTORY OF PRESENT ILLNESS: 04/09/13 (CD): Left handed gentleman seen today in a followup visit the patient was originally evaluated in early 2013, when I diagnosed him with Parkinson's disease. At the time he primarily presented with a tremor, muscle rigidity, shuffling gait and a mask face. He also reported having lost his sense of smell, and at night had muscle cramps that Kept him from getting restorative sleep.  There was no return of REM behavior at night. The patient's voice has cut more raspy and hoarse over the course I have met this patient, he also had some significant weight loss over the last 24 months.  In addition to the Parkinson's disease related symptoms of gait instability, a resting tremor, regular he also has a history of high cholesterol, anxiety , benign prostate hyperplasia and colonic polyps.  Family history his mother is deceased at age 22 due to congestive heart failure the father died at 1 years of age to a heart attack and a sister died at 97 due to complications of lupus. The couple has one son, who has normal medical history. The patient has 2 other children, 2 daughters, Siblings ( 2 brothers) are healthy.  The patient's current medication is as listed Selegiline 5 mg Twice a day , Carbidopa Levodopa 25-100 mg , TiD tablets at 7, 12 and 18.00 hours by mouth.   He also takes BuSpar 50 mg once a day, a half tablets simvastatin 40 mg daily and Cardura 4 mg tablets once a day .   After our last encounter I had ordered a polysomnography was at age patient's study to evaluate a patient for possible sleep apnea. At that time the patient endorsed the sleepiness score of 13/24 points and the backs score at 12 points.  The sleep study took place on 2-03/20/2014. The patient had an apnea index of only 2.6 and an RDI of 4.2 AHI and RDI were supine position dependence.  Oxygen  was 88%, the EKG showed PVCs but no sustained arrhythmia. Overall there was no significant evidence of apnea there was a low RDI , PLM index . Symptoms of restless legs or restless limb movement at night were not clinical voiced .  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 He has been able to gain a little weight, sleeps well on Klonopin and had no recent falls.  Exercise was interrupted after his Bilateral cataract surgeries 29-Sept 2014.   UPDATE 09/26/13 (LL):  Mr. Asbridge returns for follow up of Parkinson's Disease.  He reports that he is doing well, does not notice any wearing-off of medication between doses. He notices the medication does help him move better.  He endorses constipation but does not think he needs medication for it.  Tremor is well controlled.  Reports one instance of lightheadness, when he got up in the middle of the night.  He had a headache and went to the kitchen for an aspirin and fainted very briefly - no injury or mitigating symptoms other than headache.  UPDATE 04-03-14   Patient completed speech therapy and this made a big impact.  He walks more erect and is aware of his body posture after PT evaluation. He joined a local PD group.  He signed up with a parkinon's patient advocate ( [place]- Maggie  Barrett)  I am still convinced he should be evaluated for a DBS-  He has no psychiatric or cognitive difficulties. He has new hearing aids. Their granddaughter was just diagnosed with celiac disease.         REVIEW OF SYSTEMS: Full 14 system review of systems performed and notable only for:  Hearing loss, constipation, daytime sleepiness, snoring, lightheadedness   ALLERGIES: Allergies  Allergen Reactions  . Demerol [Meperidine]     HOME MEDICATIONS: Outpatient Prescriptions Prior to Visit  Medication Sig Dispense Refill  . busPIRone (BUSPAR) 15 MG tablet Take  15 mg by mouth daily. 1/2 tablet once daily      . doxazosin (CARDURA) 4 MG tablet Take 4 mg by mouth daily. At bedtime      . selegiline (ELDEPRYL) 5 MG capsule TAKE 1 CAPSULE BY MOUTH TWICE DAILY WITH FIRST AND LAST DOSE OF SINEMET.  180 capsule  1  . simvastatin (ZOCOR) 40 MG tablet Take 40 mg by mouth every evening. Once daily      . carbidopa-levodopa (SINEMET) 25-100 MG per tablet Take 1 tablet by mouth 3 (three) times daily. 7am, 12, 6pm  270 tablet  3  . BESIVANCE 0.6 % SUSP        No facility-administered medications prior to visit.     PHYSICAL EXAM  Filed Vitals:   04/03/14 1323  BP: 116/74  Pulse: 68  Temp: 98.4 F (36.9 C)  TempSrc: Oral  Resp: 16  Height: 5' 9.25" (1.759 m)  Weight: 158 lb 8 oz (71.895 kg)   Body mass index is 23.24 kg/(m^2).  Generalized: Well developed, in no acute distress  Head: normocephalic and atraumatic. Oropharynx benign  Neck: Supple, no carotid bruits  Cardiac: Regular rate rhythm, no murmur  Musculoskeletal: No deformity   Neurological examination  Mentation: Alert oriented to time, place, history taking. Follows all commands, speech is fluent with mild dysphonia , but not aphasia.  Cranial nerve : Pupils were equal round reactive to light extraocular movements were full, visual field were full on confrontational test.  Facial sensation intact to fine touch. Facial motor strength is asymmetric ( NEW ) with drooling on the weaker side the right lower face. Right eye is opened wider than the Left - forehead movements are symmetric.  Hearing was intact to finger rubbing bilaterally. Patient wears bilateral hearing aids. Uvula tongue midline. head turning and shoulder shrug and were normal and symmetric.Tongue protrusion into cheek strength was normal. Motor: The motor testing reveals 5 over 5 strength of all 4 extremities. Cogwheeling of the left biceps and on the left Wrist, but also over the right biceps .  Sensory: Sensory testing is  intact to pinprick, soft touch, vibration sensation, and position sense on all 4 extremities. No evidence of extinction is noted.  Coordination: Rapid alternating movements in the fingers/hands are slower , right less than left ( dominant hand for this patient ).  There is a stronger tremor in both hands , slowed finger to nose.  Gait and station: Gait is normal. Tandem gait is slowed. Romberg is negative. No drift is seen.  Reflexes: Deep tendon reflexes are symmetric and normal bilaterally.  Toes are downgoing bilaterally.   ASSESSMENT AND PLAN 68 y.o. year old male  has a past medical history of Parkinson disease; REM behavioral disorder; Tremor; Gait disorder; High cholesterol; Anxiety; BPH (benign prostatic hyperplasia); and Colon polyp. 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  Keep on current medication and  dosages. No dystonic features. Tremor and rigor with gait impairment. At the current time there is no treatment necessary for physiologic sleep disorders. Also the patient's study dated evidence of REM sleep associated increased muscle tone, he has not acted out dreams to his knowledge or his wife's. In addition there was no evidence of apnea to a clinically significant level. He did not loose oxygen saturation, nor did he have abnormal EKG findings. The we'll concentrate on the medication adjustment and for treatment of Parkinson's disease.   PLAN: Continue current doses of Sinemet and Selegiline. Increased sinemet to four times a day , 7 Am, 11 AM and 3 Pm and 8 Pm.  Continue exercise. Return to Dr. Linus Mako for DBS evaluation.  Rv with Jeani Hawking or me in 6 month .  [facility]. If your constipation becomes more of a problem for you, we have medications that may help.  Recommend that you get regular exercise and drink plenty of water during the day to stay hydrated. Follow up in 6 months, sooner as needed.       04/03/2014, 2:15  PM [facility] [address] [place], [place] [address] [phone]  Note: This document was prepared with digital dictation and possible smart phrase technology. Any transcriptional errors that result from this process are unintentional.

## 2014-04-07 DIAGNOSIS — J329 Chronic sinusitis, unspecified: Secondary | ICD-10-CM | POA: Diagnosis not present

## 2014-05-14 DIAGNOSIS — Z23 Encounter for immunization: Secondary | ICD-10-CM | POA: Diagnosis not present

## 2014-05-14 DIAGNOSIS — Z79899 Other long term (current) drug therapy: Secondary | ICD-10-CM | POA: Diagnosis not present

## 2014-05-14 DIAGNOSIS — Z0001 Encounter for general adult medical examination with abnormal findings: Secondary | ICD-10-CM | POA: Diagnosis not present

## 2014-05-14 DIAGNOSIS — E780 Pure hypercholesterolemia: Secondary | ICD-10-CM | POA: Diagnosis not present

## 2014-05-14 DIAGNOSIS — G20 Parkinson's disease: Secondary | ICD-10-CM | POA: Diagnosis not present

## 2014-05-14 DIAGNOSIS — F419 Anxiety disorder, unspecified: Secondary | ICD-10-CM | POA: Diagnosis not present

## 2014-05-14 DIAGNOSIS — R3915 Urgency of urination: Secondary | ICD-10-CM | POA: Diagnosis not present

## 2014-05-14 DIAGNOSIS — N529 Male erectile dysfunction, unspecified: Secondary | ICD-10-CM | POA: Diagnosis not present

## 2014-05-23 DIAGNOSIS — N529 Male erectile dysfunction, unspecified: Secondary | ICD-10-CM | POA: Diagnosis not present

## 2014-05-23 DIAGNOSIS — J988 Other specified respiratory disorders: Secondary | ICD-10-CM | POA: Diagnosis not present

## 2014-07-10 DIAGNOSIS — Z125 Encounter for screening for malignant neoplasm of prostate: Secondary | ICD-10-CM | POA: Diagnosis not present

## 2014-07-23 DIAGNOSIS — N289 Disorder of kidney and ureter, unspecified: Secondary | ICD-10-CM | POA: Diagnosis not present

## 2014-07-23 DIAGNOSIS — N400 Enlarged prostate without lower urinary tract symptoms: Secondary | ICD-10-CM | POA: Diagnosis not present

## 2014-07-23 DIAGNOSIS — N433 Hydrocele, unspecified: Secondary | ICD-10-CM | POA: Diagnosis not present

## 2014-07-23 DIAGNOSIS — K4090 Unilateral inguinal hernia, without obstruction or gangrene, not specified as recurrent: Secondary | ICD-10-CM | POA: Diagnosis not present

## 2014-09-02 DIAGNOSIS — G20 Parkinson's disease: Secondary | ICD-10-CM | POA: Diagnosis not present

## 2014-09-13 ENCOUNTER — Other Ambulatory Visit: Payer: Self-pay

## 2014-09-13 MED ORDER — SELEGILINE HCL 5 MG PO CAPS: ORAL_CAPSULE | ORAL | Status: DC

## 2014-10-03 DIAGNOSIS — D239 Other benign neoplasm of skin, unspecified: Secondary | ICD-10-CM | POA: Diagnosis not present

## 2014-10-07 ENCOUNTER — Ambulatory Visit (INDEPENDENT_AMBULATORY_CARE_PROVIDER_SITE_OTHER): Payer: Medicare Other | Admitting: Neurology

## 2014-10-07 ENCOUNTER — Encounter: Payer: Self-pay | Admitting: Neurology

## 2014-10-07 VITALS — BP 126/69 | HR 86 | Resp 18 | Ht 68.0 in | Wt 158.2 lb

## 2014-10-07 DIAGNOSIS — R490 Dysphonia: Secondary | ICD-10-CM | POA: Diagnosis not present

## 2014-10-07 DIAGNOSIS — G20 Parkinson's disease: Secondary | ICD-10-CM

## 2014-10-07 DIAGNOSIS — K635 Polyp of colon: Secondary | ICD-10-CM

## 2014-10-07 DIAGNOSIS — R498 Other voice and resonance disorders: Secondary | ICD-10-CM | POA: Insufficient documentation

## 2014-10-07 MED ORDER — CARBIDOPA-LEVODOPA 25-100 MG PO TABS: 1.0000 | ORAL_TABLET | Freq: Four times a day (QID) | ORAL | Status: DC

## 2014-10-07 MED ORDER — SELEGILINE HCL 5 MG PO CAPS: ORAL_CAPSULE | ORAL | Status: DC

## 2014-10-07 NOTE — Progress Notes (Signed)
PATIENT: Tim [name] DOB: 1946-05-24  REASON FOR VISIT: follow up for Parkinson's HISTORY FROM: patient  HISTORY OF PRESENT ILLNESS: 04/09/13 (CD): Left handed gentleman seen today in a followup visit the patient was originally evaluated in early 2013, when I diagnosed him with Parkinson's disease. At the time he primarily presented with a tremor, muscle rigidity, shuffling gait and a mask face. He also reported having lost his sense of smell, and at night had muscle cramps that Kept him from getting restorative sleep.  There was no return of REM behavior at night. The patient's voice has cut more raspy and hoarse over the course I have met this patient, he also had some significant weight loss over the last 24 months.  In addition to the Parkinson's disease related symptoms of gait instability, a resting tremor, regular he also has a history of high cholesterol, anxiety , benign prostate hyperplasia and colonic polyps.  Family history his mother is deceased at age 85 due to congestive heart failure the father died at 10 years of age to a heart attack and a sister died at 73 due to complications of lupus. The couple has one son, who has normal medical history. The patient has 2 other children, 2 daughters, Siblings ( 2 brothers) are healthy.  The patient's current medication is as listed Selegiline 5 mg Twice a day , Carbidopa Levodopa 25-100 mg , TiD tablets at 7, 12 and 18.00 hours by mouth.   He also takes BuSpar 50 mg once a day, a half tablets simvastatin 40 mg daily and Cardura 4 mg tablets once a day .   After our last encounter I had ordered a polysomnography was at age patient's study to evaluate a patient for possible sleep apnea. At that time the patient endorsed the sleepiness score of 13/24 points and the backs score at 12 points.  The sleep study took place on 2-03/20/2014. The patient had an apnea index of only 2.6 and an RDI of 4.2 AHI and RDI were supine position dependence.  Oxygen  was 88%, the EKG showed PVCs but no sustained arrhythmia. Overall there was no significant evidence of apnea there was a low RDI , PLM index . Symptoms of restless legs or restless limb movement at night were not clinical voiced .  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 He has been able to gain a little weight, sleeps well on Klonopin and had no recent falls.  Exercise was interrupted after his Bilateral cataract surgeries 29-Sept 2014.   UPDATE 09/26/13 (LL):  Mr. Peine returns for follow up of Parkinson's Disease.  He reports that he is doing well, does not notice any wearing-off of medication between doses. He notices the medication does help him move better.  He endorses constipation but does not think he needs medication for it.  Tremor is well controlled.  Reports one instance of lightheadness, when he got up in the middle of the night.  He had a headache and went to the kitchen for an aspirin and fainted very briefly - no injury or mitigating symptoms other than headache.  UPDATE 04-03-14 Patient completed speech therapy and this made a big impact.  He walks more erect and is aware of his body posture after PT evaluation. He joined a local PD group.  He signed up with a parkinon's patient advocate ( [place]- Maggie  Barrett) I am  still convinced he should be evaluated for a DBS-  He has no psychiatric or cognitive difficulties. He has new hearing aids. Their granddaughter was just diagnosed with celiac disease.    interval history 10-07-14: Returns today for a regular follow-up every 6 months for his Parkinson disease condition. He feels that his tremor is still well controlled since his last visit with [place]. He is a little easier startled and sometimes trembles more when under stress which is expected. No dysarthria and no dysphonia  This speech volume and cadence are normal today and he has  responded very well to speech therapy.         REVIEW OF SYSTEMS: Full 14 system review of systems performed and notable only for:  Hearing loss, constipation, daytime sleepiness, snoring,  lightheadedness  With orthostatic changes.   ALLERGIES: Allergies  Allergen Reactions  . Demerol [Meperidine]     HOME MEDICATIONS: Outpatient Prescriptions Prior to Visit  Medication Sig Dispense Refill  . busPIRone (BUSPAR) 15 MG tablet Take 15 mg by mouth daily. 1/2 tablet once daily    . carbidopa-levodopa (SINEMET) 25-100 MG per tablet Take 1 tablet by mouth 4 (four) times daily. 7am, 12, 6pm 380 tablet 3  . doxazosin (CARDURA) 4 MG tablet Take 4 mg by mouth daily. At bedtime    . selegiline (ELDEPRYL) 5 MG capsule TAKE 1 CAPSULE BY MOUTH TWICE DAILY WITH FIRST AND LAST DOSE OF SINEMET. 180 capsule 0  . simvastatin (ZOCOR) 40 MG tablet Take 40 mg by mouth every evening. Once daily     No facility-administered medications prior to visit.     PHYSICAL EXAM  Filed Vitals:   10/07/14 1404  BP: 126/69  Pulse: 86  Resp: 18  Height: _0  (1.727 m)  Weight: 158 lb 3.2 oz (71.759 kg)   Body mass index is 24.06 kg/(m^2).  Generalized: Well developed, in no acute distress  Head: normocephalic and atraumatic. Oropharynx benign  Neck: Supple, no carotid bruits  Cardiac: Regular rate rhythm, no murmur  Musculoskeletal: No deformity   Neurological examination  Mentation: Alert oriented to time, place, history taking. Follows all commands, speech is fluent with mild dysphonia , but not aphasia. He has clear speech and cadence.  Cranial nerve : Pupils were equal round reactive to light extraocular movements were full, visual field were full on confrontational test.  Facial sensation intact to fine touch. Facial motor strength is asymmetric ( NEW ) with drooling on the weaker side the right lower face.  Right eye is opened wider than the Left - forehead movements are symmetric.  Hearing  was intact to finger rubbing bilaterally. Patient wears bilateral hearing aids. Uvula tongue midline.Hhead turning and shoulder shrug and were normal in ROM  and symmetric. Tongue protrusion into cheek strength was normal. Motor: The motor testing reveals 5 over 5 strength of all 4 extremities.  Cogwheeling of the left biceps and on the left Wrist, but also over the right biceps .  Sensory: Sensory testing is intact to pinprick, soft touch, vibration sensation, and position sense on all 4 extremities. No evidence of extinction is noted.  Coordination: Rapid alternating movements in the fingers/hands are slower , right less than left ( dominant hand for this patient ).  There is a stronger tremor in both hands , slowed finger to nose.  Gait and station: Gait is normal. Tandem gait is slowed. Romberg is negative. No drift is seen.  Reflexes: Deep tendon reflexes are symmetric and normal  bilaterally.  Toes are downgoing bilaterally.   ASSESSMENT AND PLAN 69 y.o. year old male  has a past medical history of Parkinson disease; REM behavioral disorder; Tremor; Gait disorder; High cholesterol; Anxiety; BPH (benign prostatic hyperplasia); Colon polyp; and Recent skin changes (07-2014). 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  Keep on current medication and dosages. No dystonic features. Tremor and rigor with gait impairment. At the current time there is no treatment necessary for physiologic sleep disorders. Also the patient's study dated evidence of REM sleep associated increased muscle tone, he has not acted out dreams to his knowledge or his wife's. In addition there was no evidence of apnea to a clinically significant level. He did not loose oxygen saturation, nor did he have abnormal EKG findings. The we'll concentrate on the medication adjustment and for treatment of Parkinson's disease.   PLAN: Continue current doses of Sinemet and Selegiline.sinemet tat  four times a day , 7 AM, 11 AM  and 3 PM and 8 PM.  vist 30 minutes with gait exam ,muscle tone and  Speech and swallowing evaluation.  Discussion, answering questions, and information  of changing intake time for selegiline.   Selegiline at morning and at lunchtime.  Patient is still gainfully employed , full time working.  Continue exercise. Return to Dr. Linus Mako for DBS evaluation.  Rv with  me in 6 month .  [facility]. If your constipation becomes more of a problem for you, we have medications that may help.   Recommend that you get regular exercise and drink plenty of water during the day to stay hydrated. Follow up in 6 months, sooner as needed.   Jackee Glasner, MD    10/07/2014, 2:32 PM [facility] [address] [place], [place] [address] [phone]  Note: This document was prepared with digital dictation and possible smart phrase technology. Any transcriptional errors that result from this process are unintentional.

## 2014-10-15 DIAGNOSIS — Z09 Encounter for follow-up examination after completed treatment for conditions other than malignant neoplasm: Secondary | ICD-10-CM | POA: Diagnosis not present

## 2014-10-15 DIAGNOSIS — Z86010 Personal history of colonic polyps: Secondary | ICD-10-CM | POA: Diagnosis not present

## 2014-10-15 DIAGNOSIS — K5730 Diverticulosis of large intestine without perforation or abscess without bleeding: Secondary | ICD-10-CM | POA: Diagnosis not present

## 2014-10-19 ENCOUNTER — Other Ambulatory Visit: Payer: Self-pay | Admitting: Neurology

## 2014-10-23 DIAGNOSIS — L570 Actinic keratosis: Secondary | ICD-10-CM | POA: Diagnosis not present

## 2014-10-23 DIAGNOSIS — L821 Other seborrheic keratosis: Secondary | ICD-10-CM | POA: Diagnosis not present

## 2014-12-30 DIAGNOSIS — H3531 Nonexudative age-related macular degeneration: Secondary | ICD-10-CM | POA: Diagnosis not present

## 2015-01-08 DIAGNOSIS — H903 Sensorineural hearing loss, bilateral: Secondary | ICD-10-CM | POA: Diagnosis not present

## 2015-01-08 DIAGNOSIS — G20 Parkinson's disease: Secondary | ICD-10-CM | POA: Diagnosis not present

## 2015-01-08 DIAGNOSIS — H6983 Other specified disorders of Eustachian tube, bilateral: Secondary | ICD-10-CM | POA: Diagnosis not present

## 2015-03-17 DIAGNOSIS — H3531 Nonexudative age-related macular degeneration: Secondary | ICD-10-CM | POA: Diagnosis not present

## 2015-03-25 DIAGNOSIS — H903 Sensorineural hearing loss, bilateral: Secondary | ICD-10-CM | POA: Diagnosis not present

## 2015-03-25 DIAGNOSIS — H6983 Other specified disorders of Eustachian tube, bilateral: Secondary | ICD-10-CM | POA: Diagnosis not present

## 2015-03-25 DIAGNOSIS — G20 Parkinson's disease: Secondary | ICD-10-CM | POA: Diagnosis not present

## 2015-04-07 ENCOUNTER — Ambulatory Visit (INDEPENDENT_AMBULATORY_CARE_PROVIDER_SITE_OTHER): Payer: Medicare Other | Admitting: Neurology

## 2015-04-07 ENCOUNTER — Encounter: Payer: Self-pay | Admitting: Neurology

## 2015-04-07 VITALS — BP 100/76 | HR 78 | Resp 20 | Ht 68.0 in | Wt 155.0 lb

## 2015-04-07 DIAGNOSIS — G20 Parkinson's disease: Secondary | ICD-10-CM

## 2015-04-07 DIAGNOSIS — K635 Polyp of colon: Secondary | ICD-10-CM | POA: Diagnosis not present

## 2015-04-07 MED ORDER — SELEGILINE HCL 5 MG PO CAPS: ORAL_CAPSULE | ORAL | Status: DC

## 2015-04-07 MED ORDER — CARBIDOPA-LEVODOPA 25-100 MG PO TABS: 1.0000 | ORAL_TABLET | Freq: Four times a day (QID) | ORAL | Status: DC

## 2015-04-07 NOTE — Progress Notes (Signed)
PATIENT: Tim [name] DOB: 07-21-1945  REASON FOR VISIT: follow up for Parkinson's HISTORY FROM: patient  HISTORY OF PRESENT ILLNESS:  04/09/13 (CD): Left handed gentleman seen today in a followup visit the patient was originally evaluated in early 2013, when diagnosed  with Parkinson's disease.  At the time he primarily presented with a tremor, muscle rigidity, shuffling gait and a mask face. He also reported having lost his sense of smell, and at night had muscle cramps that Kept him from getting restorative sleep.  There was no return of REM behavior at night. The patient's voice has cut more raspy and hoarse over the course I have met this patient, he also had some significant weight loss over the last 24 months.  In addition to the Parkinson's disease related symptoms of gait instability, a resting tremor, regular he also has a history of high cholesterol, anxiety , benign prostate hyperplasia and colonic polyps.  Family history his mother is deceased at age 50 due to congestive heart failure the father died at 61 years of age to a heart attack and a sister died at 72 due to complications of lupus. The couple has one son, who has normal medical history. The patient has 2 other children, 2 daughters, Siblings ( 2 brothers) are healthy.  The patient's current medication is as listed Selegiline 5 mg Twice a day , Carbidopa Levodopa 25-100 mg , TiD tablets at 7, 12 and 18.00 hours by mouth.   He also takes BuSpar 50 mg once a day, a half tablets simvastatin 40 mg daily and Cardura 4 mg tablets once a day .  I had ordered a polysomnography study to evaluate a patient for possible sleep apnea. At that time the patient endorsed the sleepiness score of 13/24 points and the backs score at 12 points.  The sleep study took place on 08/23/2012. The patient had an apnea index of only 2.6 and an RDI of 4.2 AHI and RDI were supine position dependence.  Oxygen  was 88%, the EKG showed PVCs but no  sustained arrhythmia. Overall there was no significant evidence of apnea there was a low RDI , PLM index . Symptoms of restless legs or restless limb movement at night were not clinical voiced .  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  He has been able to gain a little weight, sleeps well on Klonopin and had no recent falls.  Exercise was interrupted after his Bilateral cataract surgeries 29-Sept 2014.   UPDATE 09/26/13 (LL):  Tim [name] returns for follow up of Parkinson's Disease.  He reports that he is doing well, does not notice any wearing-off of medication between doses. He notices the medication does help him move better.  He endorses constipation but does not think he needs medication for it.  Tremor is well controlled.  Reports one instance of lightheadness, when he got up in the middle of the night.  He had a headache and went to the kitchen for an aspirin and fainted very briefly - no injury or mitigating symptoms other than headache.  UPDATE 04-03-14 Patient completed speech therapy and this made a big impact.  He walks more erect and is aware of his body posture after PT evaluation. He joined a local PD group. He signed up with a parkinon's patient advocate ( [place]- Tim  [name]) I am still convinced he should be evaluated for  a DBS-  He has no psychiatric or cognitive difficulties. He has new hearing aids. Their granddaughter was just diagnosed with celiac disease.   Interval 10-07-14: Returns today for a regular follow-up every 6 months for his Parkinson disease condition. He feels that his tremor is still well controlled since his last visit with [place]. He is a little easier startled and sometimes trembles more when under stress which is expected. No dysarthria and no dysphonia  This speech volume and cadence are normal today and he has responded very well to speech therapy.  Interval  04-07-2015 ( CD )  The patient is here on his regular 6 month revisit for his Parkinson's disease. He was able to rise from the chair with his arms crossed in front of him get up and go without delay. He turns with 4 steps before he can sit up on the exam table he has palpable rigidity and cogwheeling over both biceps right more than left. He does not have a TMJ click he has a little bit of tension and related rigidity in his neck spine. Also paraspinal tenderness. He has a rare blink reflex but  facial symmetry is present  With a slightly masked facial expression.  His smile is depressed, too. Loss of forehead wrinkles.    REVIEW OF SYSTEMS: Full 14 system review of systems performed and notable only for:  Hearing loss, constipation, daytime sleepiness, snoring,  lightheadedness with orthostatic changes, improved with hydrate.   ALLERGIES: Allergies  Allergen Reactions  . Demerol [Meperidine]     HOME MEDICATIONS: Outpatient Prescriptions Prior to Visit  Medication Sig Dispense Refill  . busPIRone (BUSPAR) 15 MG tablet Take 15 mg by mouth daily. 1/2 tablet once daily    . carbidopa-levodopa (SINEMET) 25-100 MG per tablet Take 1 tablet by mouth 4 (four) times daily. 7am, 12, 6pm 380 tablet 3  . doxazosin (CARDURA) 4 MG tablet Take 4 mg by mouth daily. At bedtime    . selegiline (ELDEPRYL) 5 MG capsule TAKE 1 CAPSULE BY MOUTH TWICE DAILY WITH FIRST AND LAST DOSE OF SINEMET. 180 capsule 3  . simvastatin (ZOCOR) 40 MG tablet Take 40 mg by mouth every evening. Once daily    . mupirocin ointment (BACTROBAN) 2 % Apply 1 application topically 2 (two) times daily.     . selegiline (ELDEPRYL) 5 MG capsule TAKE 1 CAPSULE BY MOUTH TWICE DAILY WITH FIRST AND LAST DOSE OF SINEMET. 180 capsule 3   No facility-administered medications prior to visit.     PHYSICAL EXAM  Filed Vitals:   04/07/15 1108  BP: 100/76  Pulse: 78  Resp: 20  Height: _0  (1.727 m)  Weight: 155 lb (70.308 kg)   Body  mass index is 23.57 kg/(m^2).  Generalized: Well developed, in no acute distress  Head: normocephalic and atraumatic. Oropharynx benign  Neck: Supple, no carotid bruits  Cardiac: Regular rate rhythm, no murmur  Musculoskeletal: No deformity   Neurological examination  Mentation: Alert oriented to time, place, history taking. Follows all commands, speech is fluent with mild dysphonia , but not aphasia. He has clear speech and cadence.  Cranial nerve : Pupils were equal round reactive to light extraocular movements were full, visual field were full on confrontational test.  Facial sensation intact to fine touch. Facial motor strength is asymmetric ( NEW ) with drooling on the weaker side the right lower face.  Right eye is opened wider than the Left - forehead movements are symmetric.  Hearing was  intact to finger rubbing bilaterally. Patient wears bilateral hearing aids. Uvula tongue midline.Hhead turning and shoulder shrug and were normal in ROM  and symmetric. Tongue protrusion into cheek strength was normal. Motor:  Cogwheeling in both  biceps and on the  Wrists, slightly GERD, very mild tremor in the hand at rest. The patient prefers to have his hands fisted or to sit on his hands to not have the tremor appear. There is also a droopy right shoulder noted. There is more rigor over the right shoulder and there is more palpable tension over the paraspinal area. The patient advised me that he had a broken vertebrae, he had been offered to undergo fusion, 40 years ago. This is by now  biologically fused.  Sensory: Sensory testing is intact to pinprick, soft touch, vibration sensation, and position sense on all 4 extremities. No evidence of extinction is noted.  Coordination: Rapid alternating movements in the fingers/hands are slower , right less than left ( dominant hand for this patient ). There is a stronger tremor in both hands , slowed finger to nose.  Gait and station: Gait is normal. Tandem  gait is slowed. Romberg is negative. No drift is seen.  Reflexes: Deep tendon reflexes are symmetric and normal bilaterally.  Toes are downgoing bilaterally.   ASSESSMENT AND PLAN 69 y.o. year old here with PD with a masked face, right shoulder droop, hemifacial weakness ( facial plegia ).  Keep on current medication but increase the dosage. No dystonic features, but neck stiffness which may be related to a vertebral dysfunction (?) .  Tremor and rigor with gait impairment and severely reduced arm swing, but able to rise from a seated position without bracing himself.  . At the current time there is no treatment necessary for physiologic sleep disorders.  Also the patient's study dated evidence of REM sleep associated increased muscle tone, he has not acted out dreams to his knowledge or his wife's.  In addition there was no evidence of apnea to a clinically significant level. He did not loose oxygen saturation, nor did he have abnormal EKG findings. The we'll concentrate on the medication adjustment and for treatment of Parkinson's disease.   PLAN: Continue current doses of Sinemet and Selegiline, four times a day , 7 AM, 11 AM and 3 PM and 8 PM.  visit 30 minutes with more than 50% of the face to face time dedicated w to medication management and encouraging exercise program. " Silver sneakers"  Wife and husband a can now go together. Discussion, answering questions, and information  of changing intake time for selegiline.   Selegiline at morning and at lunchtime.  Patient is still gainfully employed , full time working. He has not exercised for over 6 month ! Other than increasing the medication dose I have encouraged the patient to return to a regular exercise program may be a program that the couple can even do to gather or at the same facility. Continued exercise encouraged . Return to Dr. Linus Mako ,  patient has alternated visits with me.  Rv with me in 6 month .  [facility]. If your constipation becomes more of a problem for you, we have medications that may help.   Recommend that you get regular exercise and drink plenty of water during the day to stay hydrated. Follow up in 6 months, sooner as needed.   Morelia Cassells, MD  Cc Dr. Tobi Bastos, MD  04/07/2015, 11:30 AM [facility] [address], [place], [place] [address] [phone]  Note: This document was prepared with digital dictation and possible smart phrase technology. Any transcriptional errors that result from this process are unintentional.

## 2015-04-07 NOTE — Addendum Note (Signed)
Addended by: Larey Seat on: 04/07/2015 12:06 PM   Modules accepted: Orders

## 2015-04-07 NOTE — Patient Instructions (Signed)
Follow-up appointment with Dr. Deboraha Sprang in March at [facility], Dr. Deboraha Sprang had avaluated  the patient in the past.

## 2015-06-03 DIAGNOSIS — N529 Male erectile dysfunction, unspecified: Secondary | ICD-10-CM | POA: Diagnosis not present

## 2015-06-03 DIAGNOSIS — E7800 Pure hypercholesterolemia, unspecified: Secondary | ICD-10-CM | POA: Diagnosis not present

## 2015-06-03 DIAGNOSIS — Z23 Encounter for immunization: Secondary | ICD-10-CM | POA: Diagnosis not present

## 2015-06-03 DIAGNOSIS — F419 Anxiety disorder, unspecified: Secondary | ICD-10-CM | POA: Diagnosis not present

## 2015-06-03 DIAGNOSIS — G20 Parkinson's disease: Secondary | ICD-10-CM | POA: Diagnosis not present

## 2015-06-03 DIAGNOSIS — R3915 Urgency of urination: Secondary | ICD-10-CM | POA: Diagnosis not present

## 2015-06-03 DIAGNOSIS — Z79899 Other long term (current) drug therapy: Secondary | ICD-10-CM | POA: Diagnosis not present

## 2015-06-03 DIAGNOSIS — Z0001 Encounter for general adult medical examination with abnormal findings: Secondary | ICD-10-CM | POA: Diagnosis not present

## 2015-07-27 DIAGNOSIS — N400 Enlarged prostate without lower urinary tract symptoms: Secondary | ICD-10-CM | POA: Diagnosis not present

## 2015-08-10 DIAGNOSIS — N35010 Post-traumatic urethral stricture, male, meatal: Secondary | ICD-10-CM | POA: Diagnosis not present

## 2015-08-10 DIAGNOSIS — Z0000 Encounter for general adult medical examination without abnormal findings: Secondary | ICD-10-CM | POA: Diagnosis not present

## 2015-08-10 DIAGNOSIS — N400 Enlarged prostate without lower urinary tract symptoms: Secondary | ICD-10-CM | POA: Diagnosis not present

## 2015-08-10 DIAGNOSIS — N289 Disorder of kidney and ureter, unspecified: Secondary | ICD-10-CM | POA: Diagnosis not present

## 2015-09-08 DIAGNOSIS — G20 Parkinson's disease: Secondary | ICD-10-CM | POA: Diagnosis not present

## 2015-09-08 DIAGNOSIS — H903 Sensorineural hearing loss, bilateral: Secondary | ICD-10-CM | POA: Diagnosis not present

## 2015-09-08 DIAGNOSIS — H60391 Other infective otitis externa, right ear: Secondary | ICD-10-CM | POA: Diagnosis not present

## 2015-09-08 DIAGNOSIS — H6983 Other specified disorders of Eustachian tube, bilateral: Secondary | ICD-10-CM | POA: Diagnosis not present

## 2015-09-09 DIAGNOSIS — G20 Parkinson's disease: Secondary | ICD-10-CM | POA: Diagnosis not present

## 2015-09-09 DIAGNOSIS — Z885 Allergy status to narcotic agent status: Secondary | ICD-10-CM | POA: Diagnosis not present

## 2015-09-09 DIAGNOSIS — E785 Hyperlipidemia, unspecified: Secondary | ICD-10-CM | POA: Diagnosis not present

## 2015-09-10 ENCOUNTER — Ambulatory Visit: Payer: Medicare Other | Admitting: Neurology

## 2015-09-16 ENCOUNTER — Encounter: Payer: Self-pay | Admitting: Neurology

## 2015-09-16 ENCOUNTER — Ambulatory Visit (INDEPENDENT_AMBULATORY_CARE_PROVIDER_SITE_OTHER): Payer: Medicare Other | Admitting: Neurology

## 2015-09-16 VITALS — BP 118/72 | HR 68 | Resp 20 | Ht 68.0 in | Wt 149.0 lb

## 2015-09-16 DIAGNOSIS — G20 Parkinson's disease: Secondary | ICD-10-CM

## 2015-09-16 NOTE — Progress Notes (Signed)
PATIENT: Tim [name] DOB: 07-21-1945  REASON FOR VISIT: follow up for Parkinson's HISTORY FROM: patient  HISTORY OF PRESENT ILLNESS:  04/09/13 (CD): Left handed gentleman seen today in a followup visit the patient was originally evaluated in early 2013, when diagnosed  with Parkinson's disease.  At the time he primarily presented with a tremor, muscle rigidity, shuffling gait and a mask face. He also reported having lost his sense of smell, and at night had muscle cramps that Kept him from getting restorative sleep.  There was no return of REM behavior at night. The patient's voice has cut more raspy and hoarse over the course I have met this patient, he also had some significant weight loss over the last 24 months.  In addition to the Parkinson's disease related symptoms of gait instability, a resting tremor, regular he also has a history of high cholesterol, anxiety , benign prostate hyperplasia and colonic polyps.  Family history his mother is deceased at age 50 due to congestive heart failure the father died at 61 years of age to a heart attack and a sister died at 72 due to complications of lupus. The couple has one son, who has normal medical history. The patient has 2 other children, 2 daughters, Siblings ( 2 brothers) are healthy.  The patient's current medication is as listed Selegiline 5 mg Twice a day , Carbidopa Levodopa 25-100 mg , TiD tablets at 7, 12 and 18.00 hours by mouth.   He also takes BuSpar 50 mg once a day, a half tablets simvastatin 40 mg daily and Cardura 4 mg tablets once a day .  I had ordered a polysomnography study to evaluate a patient for possible sleep apnea. At that time the patient endorsed the sleepiness score of 13/24 points and the backs score at 12 points.  The sleep study took place on 08/23/2012. The patient had an apnea index of only 2.6 and an RDI of 4.2 AHI and RDI were supine position dependence.  Oxygen  was 88%, the EKG showed PVCs but no  sustained arrhythmia. Overall there was no significant evidence of apnea there was a low RDI , PLM index . Symptoms of restless legs or restless limb movement at night were not clinical voiced .  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  He has been able to gain a little weight, sleeps well on Klonopin and had no recent falls.  Exercise was interrupted after his Bilateral cataract surgeries 29-Sept 2014.   UPDATE 09/26/13 (LL):  Tim [name] returns for follow up of Parkinson's Disease.  He reports that he is doing well, does not notice any wearing-off of medication between doses. He notices the medication does help him move better.  He endorses constipation but does not think he needs medication for it.  Tremor is well controlled.  Reports one instance of lightheadness, when he got up in the middle of the night.  He had a headache and went to the kitchen for an aspirin and fainted very briefly - no injury or mitigating symptoms other than headache.  UPDATE 04-03-14 Patient completed speech therapy and this made a big impact.  He walks more erect and is aware of his body posture after PT evaluation. He joined a local PD group. He signed up with a parkinon's patient advocate ( [place]- Maggie  Barrett) I am still convinced he should be evaluated for  a DBS-  He has no psychiatric or cognitive difficulties. He has new hearing aids. Their granddaughter was just diagnosed with celiac disease.   Interval 10-07-14: Returns today for a regular follow-up every 6 months for his Parkinson disease condition. He feels that his tremor is still well controlled since his last visit with [place]. He is a little easier startled and sometimes trembles more when under stress which is expected. No dysarthria and no dysphonia  This speech volume and cadence are normal today and he has responded very well to speech therapy.  Interval  04-07-2015 ( CD )  The patient is here on his regular 6 month revisit for his Parkinson's disease. He was able to rise from the chair with his arms crossed in front of him get up and go without delay. He turns with 4 steps before he can sit up on the exam table he has palpable rigidity and cogwheeling over both biceps right more than left. He does not have a TMJ click he has a little bit of tension and related rigidity in his neck spine. Also paraspinal tenderness. He has a rare blink reflex but  facial symmetry is present  With a slightly masked facial expression.  His smile is depressed, too. Loss of forehead wrinkles.  Interval history from 09/16/2015, Tim [name] is here today following on last week's visit with Dr. Deboraha Sprang at [place] -Dr. Deboraha Sprang started him on Mirapex 0.125 mg generic form 4 times a day. He would like to follow Tim [name] be intact and intervals. Prior to the Mirapex introduction the patient had felt a week hearing off of the dose effect after about 3 hours intake time post Sinemet. He then would find that his left side was heavy or drawn. This seems to have improved with the Mirapex. He also presents with very little tremor today. It has decreased his hand tremor significantly , he took his medication at 8 AM - about 3 hours ago.       REVIEW OF SYSTEMS: Full 14 system review of systems performed and notable only for:  Hearing loss, constipation, daytime sleepiness, snoring,  lightheadedness with orthostatic changes, improved with hydrate.   ALLERGIES: Allergies  Allergen Reactions  . Demerol [Meperidine]     HOME MEDICATIONS: Outpatient Prescriptions Prior to Visit  Medication Sig Dispense Refill  . busPIRone (BUSPAR) 15 MG tablet Take 15 mg by mouth daily. 1/2 tablet once daily    . carbidopa-levodopa (SINEMET) 25-100 MG tablet Take 1 tablet by mouth 4 (four) times daily. 7am, 12, 6pm 380 tablet 3  . doxazosin (CARDURA) 4 MG tablet Take 4 mg by mouth  daily. At bedtime    . selegiline (ELDEPRYL) 5 MG capsule TAKE 1 CAPSULE BY MOUTH TWICE DAILY WITH FIRST AND LAST DOSE OF SINEMET. 180 capsule 3  . simvastatin (ZOCOR) 40 MG tablet Take 40 mg by mouth every evening. Once daily     No facility-administered medications prior to visit.     PHYSICAL EXAM  Filed Vitals:   09/16/15 1036  BP: 118/72  Pulse: 68  Resp: 20  Height: '5\' 8"'$  (1.727 m)  Weight: 149 lb (67.586 kg)   Body mass index is 22.66 kg/(m^2).  Generalized: Well developed, in no acute distress  Head: normocephalic and atraumatic. Oropharynx benign  Neck: Supple, no carotid bruits  Cardiac: Regular rate rhythm, no murmur  Musculoskeletal: No deformity   Neurological examination  Mentation: Alert oriented to time, place, history taking. Follows all commands,  speech is fluent with mild dysphonia , but not aphasia. He has clear speech and cadence.  Montreal Cognitive Assessment  09/16/2015  Visuospatial/ Executive (0/5) 4  Naming (0/3) 3  Attention: Read list of digits (0/2) 2  Attention: Read list of letters (0/1) 1  Attention: Serial 7 subtraction starting at 100 (0/3) 3  Language: Repeat phrase (0/2) 2  Language : Fluency (0/1) 1  Abstraction (0/2) 2  Delayed Recall (0/5) 4  Orientation (0/6) 6  Total 28  Adjusted Score (based on education) 28    Cranial nerve : Pupils were equal round reactive to light extraocular movements were full, visual field were full on confrontational test.  Facial sensation intact to fine touch. Facial motor strength is asymmetric ( NEW ) with drooling on the weaker side the right lower face.  Right eye is opened wider than the Left - forehead movements are symmetric.  Hearing was intact to finger rubbing bilaterally. Patient wears bilateral hearing aids. Uvula tongue midline.Hhead turning and shoulder shrug and were normal in ROM  and symmetric. Tongue protrusion into cheek strength was normal. Motor:  Cogwheeling in both  biceps and  on the  Wrists, slightly GERD, very mild tremor in the hand at rest. The patient prefers to have his hands fisted or to sit on his hands to not have the tremor appear. There is also a droopy right shoulder noted. There is more rigor over the right shoulder and there is more palpable tension over the paraspinal area. The patient advised me that he had a broken vertebrae, he had been offered to undergo fusion, 40 years ago. This is by now  biologically fused.  Sensory: Sensory testing is intact to pinprick, soft touch, vibration sensation, and position sense on all 4 extremities. No evidence of extinction is noted.  Coordination: Rapid alternating movements in the fingers/hands are slower , right less than left ( dominant hand for this patient ). There is a stronger tremor in both hands , slowed finger to nose.  Gait and station: Gait is normal. Tandem gait is slowed. Romberg is negative. No drift is seen.  Reflexes: Deep tendon reflexes are symmetric and normal bilaterally.  Toes are downgoing bilaterally.   ASSESSMENT AND PLAN 70 y.o. year old here with PD with a masked face, right shoulder droop, hemifacial weakness ( facial plegia ).   Keep on current medication but increase the dosage. No dystonic features, but neck stiffness which may be related to a vertebral dysfunction (?) .  Tremor and rigor with gait impairment and severely reduced arm swing, but able to rise from a seated position without bracing himself.   . At the current time there is no treatment necessary for physiologic sleep disorders.  Also the patient's study dated evidence of REM sleep associated increased muscle tone, he has not acted out dreams to his knowledge or his wife's.  In addition there was no evidence of apnea to a clinically significant level. He did not loose oxygen saturation, nor did he have abnormal EKG findings. The we'll concentrate on the medication adjustment and for treatment of Parkinson's disease.    PLAN: Continue current doses of Sinemet and Selegiline, four times a day , 7 AM, 11 AM and 3 PM and 8 PM.  visit 30 minutes with more than 50% of the face to face time dedicated w to medication management and encouraging exercise program. " Silver sneakers"  Wife and husband a can now go together. Discussion, answering questions, and  information  of changing intake time for selegiline.   Selegiline at morning and at lunchtime. Mirapex 0.125 mg four times daily. Sinemet as above.  Patient is still gainfully employed , full time working. He has not exercised for over 9 month !  j He seems depressed.  Rv with me in 6 month .  [facility]. If your constipation becomes more of a problem for you, we have medications that may help.   Recommend that you get regular exercise and drink plenty of water during the day to stay hydrated. Follow up in 6 months, sooner as needed.   Jagjit Riner, MD  Cc Dr. Tobi Bastos, MD   09/16/2015, 11:01 AM [facility] [address], [place], [place] [address] [phone]

## 2015-09-16 NOTE — Patient Instructions (Signed)
Pramipexole tablets What is this medicine? PRAMIPEXOLE (pra mi PEX ole) is used to treat symptoms of Parkinson's disease. It is also used to treat Restless Legs Syndrome. This medicine may be used for other purposes; ask your health care provider or pharmacist if you have questions. What should I tell my health care provider before I take this medicine? They need to know if you have any of these conditions: -dizzy or fainting spells -heart disease -kidney disease -low blood pressure -schizophrenia -sleeping problems -an unusual or allergic reaction to pramipexole, other medicines, foods, dyes, or preservatives -pregnant or trying to get pregnant -breast-feeding How should I use this medicine? Take this medicine by mouth with a glass of water. Follow the directions on the prescription label. Take with food. Take your doses at regular intervals. Do not take your medicine more often than directed. Do not stop taking except on the advice of your doctor or health care professional. Talk to your pediatrician regarding the use of this medicine in children. Special care may be needed. Overdosage: If you think you have taken too much of this medicine contact a poison control center or emergency room at once. NOTE: This medicine is only for you. Do not share this medicine with others. What if I miss a dose? If you miss a dose, take it as soon as you can. If it is almost time for your next dose, take only that dose. Do not take double or extra doses. What may interact with this medicine? -amantadine -cimetidine -diltiazem -medicines for mental problems or psychotic disturbances -medicines for sleep -metoclopramide -quinidine or quinine -ranitidine -triamterene -verapamil This list may not describe all possible interactions. Give your health care provider a list of all the medicines, herbs, non-prescription drugs, or dietary supplements you use. Also tell them if you smoke, drink alcohol, or use  illegal drugs. Some items may interact with your medicine. What should I watch for while using this medicine? Visit your doctor or health care professional for regular checks on your progress. It may be several weeks or months before you feel the full effect of this medicine. Continue to take your medicine on a regular schedule. You may get drowsy or dizzy. Do not drive, use machinery, or do anything that needs mental alertness until you know how this drug affects you. Do not stand or sit up quickly, especially if you are an older patient. This reduces the risk of dizzy or fainting spells. If you find that you have sudden feelings of wanting to sleep during normal activities, like cooking, watching television, or while driving or riding in a car, you should contact your health care professional. Your mouth may get dry. Chewing sugarless gum or sucking hard candy, and drinking plenty of water may help. Contact your doctor if the problem does not go away or is severe. There have been reports of increased sexual urges or other strong urges such as gambling while taking some medicines for Parkinson's disease. If you experience any of these urges while taking this medicine, you should report it to your health care provider as soon as possible. You should check your skin often for changes to moles and new growths while taking this medicine. Call your doctor if you notice any of these changes. What side effects may I notice from receiving this medicine? Side effects that you should report to your doctor or health care professional as soon as possible: -confusion -double vision or other vision problems -fainting spells -falling asleep during normal  activities like driving -hallucination, loss of contact with reality -mental changes -muscle pain or severe muscle weakness -uncontrollable movements of the arms, face, hands, head, mouth, shoulders, or upper body Side effects that usually do not require medical  attention (report to your doctor or health care professional if they continue or are bothersome): -constipation -frequent urination -mild weakness -nausea This list may not describe all possible side effects. Call your doctor for medical advice about side effects. You may report side effects to FDA at 1-800-FDA-1088. Where should I keep my medicine? Keep out of the reach of children. Store at room temperature between 15 and 30 degrees C (59 and 86 degrees F). Protect from light. Throw away any unused medicine after the expiration date. NOTE: This sheet is a summary. It may not cover all possible information. If you have questions about this medicine, talk to your doctor, pharmacist, or health care provider.    2016, Elsevier/Gold Standard. (2013-11-04 15:53:08)

## 2015-09-22 ENCOUNTER — Encounter: Payer: Self-pay | Admitting: *Deleted

## 2016-02-18 ENCOUNTER — Encounter: Payer: Self-pay | Admitting: Adult Health

## 2016-02-18 ENCOUNTER — Ambulatory Visit (INDEPENDENT_AMBULATORY_CARE_PROVIDER_SITE_OTHER): Payer: Medicare Other | Admitting: Adult Health

## 2016-02-18 VITALS — BP 105/67 | HR 80 | Ht 68.0 in | Wt 152.8 lb

## 2016-02-18 DIAGNOSIS — G20 Parkinson's disease: Secondary | ICD-10-CM
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
## 2016-02-18 NOTE — Progress Notes (Signed)
I agree with the assessment and plan as directed by NP .The patient is known to me .   Desarea Ohagan, MD  

## 2016-02-18 NOTE — Patient Instructions (Signed)
Continue Sinemet, selegiline and mirapex If your symptoms worsen or you develop new symptoms please let us know.

## 2016-02-22 ENCOUNTER — Other Ambulatory Visit: Payer: Self-pay

## 2016-02-22 MED ORDER — SELEGILINE HCL 5 MG PO CAPS: ORAL_CAPSULE | ORAL | 3 refills | Status: DC

## 2016-02-23 ENCOUNTER — Telehealth: Payer: Self-pay

## 2016-02-23 NOTE — Telephone Encounter (Signed)
Received fax from pt's pharmacy. They need approval from Dr. Brett Fairy that she is aware of the interaction between selegiline and buspirone before they will fill the selegiline.  May I give them approval to fill and advise them that you are aware of the potential interaction between selegiline and buspirone?

## 2016-02-24 NOTE — Telephone Encounter (Signed)
I spoke to April, pharmacist at [facility] and advised her that Dr. Brett Fairy is aware of the interaction and pt has been educated. April verbalized understanding.

## 2016-02-24 NOTE — Telephone Encounter (Signed)
I am aware and the patient has been educated about the potential side effects. Please fill. CD

## 2016-04-11 ENCOUNTER — Other Ambulatory Visit: Payer: Self-pay

## 2016-04-11 DIAGNOSIS — G20 Parkinson's disease: Secondary | ICD-10-CM

## 2016-04-11 MED ORDER — CARBIDOPA-LEVODOPA 25-100 MG PO TABS: 1.0000 | ORAL_TABLET | Freq: Four times a day (QID) | ORAL | 3 refills | Status: DC

## 2016-04-11 NOTE — Telephone Encounter (Signed)
RX for sinemet faxed to [facility]. Received a receipt of confirmation.

## 2016-05-12 DIAGNOSIS — J321 Chronic frontal sinusitis: Secondary | ICD-10-CM | POA: Diagnosis not present

## 2016-06-03 DIAGNOSIS — M549 Dorsalgia, unspecified: Secondary | ICD-10-CM | POA: Diagnosis not present

## 2016-06-03 DIAGNOSIS — Z79899 Other long term (current) drug therapy: Secondary | ICD-10-CM | POA: Diagnosis not present

## 2016-06-03 DIAGNOSIS — Z0001 Encounter for general adult medical examination with abnormal findings: Secondary | ICD-10-CM | POA: Diagnosis not present

## 2016-06-03 DIAGNOSIS — R3915 Urgency of urination: Secondary | ICD-10-CM | POA: Diagnosis not present

## 2016-06-03 DIAGNOSIS — G20 Parkinson's disease: Secondary | ICD-10-CM | POA: Diagnosis not present

## 2016-06-03 DIAGNOSIS — R339 Retention of urine, unspecified: Secondary | ICD-10-CM | POA: Diagnosis not present

## 2016-06-03 DIAGNOSIS — F419 Anxiety disorder, unspecified: Secondary | ICD-10-CM | POA: Diagnosis not present

## 2016-06-03 DIAGNOSIS — N390 Urinary tract infection, site not specified: Secondary | ICD-10-CM | POA: Diagnosis not present

## 2016-06-03 DIAGNOSIS — E7800 Pure hypercholesterolemia, unspecified: Secondary | ICD-10-CM | POA: Diagnosis not present

## 2016-06-05 ENCOUNTER — Emergency Department (HOSPITAL_COMMUNITY)
Admission: EM | Admit: 2016-06-05 | Discharge: 2016-06-05 | Disposition: A | Discharge status: Home / Self Care | Payer: Medicare Other | Attending: Emergency Medicine | Admitting: Emergency Medicine

## 2016-06-05 ENCOUNTER — Emergency Department (HOSPITAL_COMMUNITY): Payer: Medicare Other

## 2016-06-05 DIAGNOSIS — R300 Dysuria: Secondary | ICD-10-CM | POA: Diagnosis not present

## 2016-06-05 DIAGNOSIS — N390 Urinary tract infection, site not specified: Secondary | ICD-10-CM | POA: Diagnosis not present

## 2016-06-05 DIAGNOSIS — Z79899 Other long term (current) drug therapy: Secondary | ICD-10-CM | POA: Diagnosis not present

## 2016-06-05 DIAGNOSIS — G20 Parkinson's disease: Secondary | ICD-10-CM | POA: Insufficient documentation

## 2016-06-05 DIAGNOSIS — R1030 Lower abdominal pain, unspecified: Secondary | ICD-10-CM | POA: Insufficient documentation

## 2016-06-05 DIAGNOSIS — R3912 Poor urinary stream: Secondary | ICD-10-CM | POA: Diagnosis not present

## 2016-06-05 LAB — URINALYSIS, ROUTINE W REFLEX MICROSCOPIC
GLUCOSE, UA: NEGATIVE mg/dL
Hgb urine dipstick: NEGATIVE
Ketones, ur: NEGATIVE mg/dL
Nitrite: NEGATIVE
PH: 6 (ref 5.0–8.0)
Protein, ur: 30 mg/dL — AB
Specific Gravity, Urine: 1.026 (ref 1.005–1.030)

## 2016-06-05 LAB — CBC WITH DIFFERENTIAL/PLATELET
Basophils Absolute: 0 10*3/uL (ref 0.0–0.1)
Basophils Relative: 0 %
EOS ABS: 0.1 10*3/uL (ref 0.0–0.7)
Eosinophils Relative: 1 %
HEMATOCRIT: 36.2 % — AB (ref 39.0–52.0)
HEMOGLOBIN: 12.3 g/dL — AB (ref 13.0–17.0)
LYMPHS ABS: 1.7 10*3/uL (ref 0.7–4.0)
Lymphocytes Relative: 14 %
MCH: 29.6 pg (ref 26.0–34.0)
MCHC: 34 g/dL (ref 30.0–36.0)
MCV: 87 fL (ref 78.0–100.0)
MONOS PCT: 7 %
Monocytes Absolute: 0.9 10*3/uL (ref 0.1–1.0)
NEUTROS PCT: 78 %
Neutro Abs: 9.8 10*3/uL — ABNORMAL HIGH (ref 1.7–7.7)
Platelets: 161 10*3/uL (ref 150–400)
RBC: 4.16 MIL/uL — ABNORMAL LOW (ref 4.22–5.81)
RDW: 14.4 % (ref 11.5–15.5)
WBC: 12.5 10*3/uL — ABNORMAL HIGH (ref 4.0–10.5)

## 2016-06-05 LAB — URINE MICROSCOPIC-ADD ON: RBC / HPF: NONE SEEN RBC/hpf (ref 0–5)

## 2016-06-05 LAB — BASIC METABOLIC PANEL
Anion gap: 9 (ref 5–15)
BUN: 16 mg/dL (ref 6–20)
CHLORIDE: 105 mmol/L (ref 101–111)
CO2: 23 mmol/L (ref 22–32)
CREATININE: 1 mg/dL (ref 0.61–1.24)
Calcium: 9.1 mg/dL (ref 8.9–10.3)
GFR calc non Af Amer: >60 mL/min (ref >60)
Glucose, Bld: 104 mg/dL — ABNORMAL HIGH (ref 65–99)
Potassium: 3.7 mmol/L (ref 3.5–5.1)
Sodium: 137 mmol/L (ref 135–145)

## 2016-06-05 MED ORDER — LEVOFLOXACIN 750 MG PO TABS
750.0000 mg | ORAL_TABLET | Freq: Once | ORAL | Status: AC
  Administered 2016-06-05: 750 mg via ORAL
  Filled 2016-06-05: qty 1
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
## 2016-06-05 NOTE — Discharge Instructions (Signed)
Continue your antibiotics (Keflex) as prescribed by your doctor.   You have been given one dose of Levaquin 750mg  in the emergency department to broaden coverage for suspected urinary tract infection. You need to contact your doctor's office tomorrow morning so they can review the results of your urine culture done Friday. This needs to be assessed to see if you need an alternate or second antibiotic.  Measure your temperature 3 times a day. If you develop fever of 100.6 or greater, return to the emergency department.  There was an incidental finding on your CT scan of an enlarging liver nodule that needs to be followed up on with your doctor.

## 2016-06-05 NOTE — ED Notes (Addendum)
Bladder scan showed 52ml by previous shift. Pt told previous shift he had not been drinking much fluids at home

## 2016-06-05 NOTE — ED Notes (Signed)
ED Provider at bedside. 

## 2016-06-05 NOTE — ED Triage Notes (Signed)
Only urinating small amounts since Thursday no n/v no fever voiced no abdominal pain voiced.

## 2016-06-06 DIAGNOSIS — N3000 Acute cystitis without hematuria: Secondary | ICD-10-CM | POA: Diagnosis not present

## 2016-06-06 LAB — URINE CULTURE: CULTURE: NO GROWTH

## 2016-06-15 DIAGNOSIS — N3000 Acute cystitis without hematuria: Secondary | ICD-10-CM | POA: Diagnosis not present

## 2016-06-16 ENCOUNTER — Ambulatory Visit: Payer: Medicare Other | Attending: Family Medicine | Admitting: Physical Therapy

## 2016-06-16 DIAGNOSIS — R2681 Unsteadiness on feet: Secondary | ICD-10-CM | POA: Diagnosis not present

## 2016-06-16 DIAGNOSIS — M6281 Muscle weakness (generalized): Secondary | ICD-10-CM | POA: Insufficient documentation

## 2016-06-16 DIAGNOSIS — R2689 Other abnormalities of gait and mobility: Secondary | ICD-10-CM | POA: Diagnosis not present

## 2016-06-16 DIAGNOSIS — R293 Abnormal posture: Secondary | ICD-10-CM | POA: Diagnosis not present

## 2016-06-16 DIAGNOSIS — N3000 Acute cystitis without hematuria: Secondary | ICD-10-CM | POA: Diagnosis not present
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
## 2016-06-17 DIAGNOSIS — N3000 Acute cystitis without hematuria: Secondary | ICD-10-CM | POA: Diagnosis not present

## 2016-06-22 ENCOUNTER — Ambulatory Visit: Payer: Medicare Other | Admitting: Physical Therapy

## 2016-06-22 DIAGNOSIS — R293 Abnormal posture: Secondary | ICD-10-CM | POA: Diagnosis not present

## 2016-06-22 DIAGNOSIS — M6281 Muscle weakness (generalized): Secondary | ICD-10-CM | POA: Diagnosis not present

## 2016-06-22 DIAGNOSIS — R2681 Unsteadiness on feet: Secondary | ICD-10-CM | POA: Diagnosis not present

## 2016-06-22 DIAGNOSIS — R2689 Other abnormalities of gait and mobility: Secondary | ICD-10-CM | POA: Diagnosis not present
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
## 2016-06-29 ENCOUNTER — Ambulatory Visit: Payer: Medicare Other | Admitting: Physical Therapy

## 2016-07-13 ENCOUNTER — Ambulatory Visit: Payer: Medicare Other | Attending: Family Medicine | Admitting: Physical Therapy

## 2016-07-13 DIAGNOSIS — R2689 Other abnormalities of gait and mobility: Secondary | ICD-10-CM | POA: Diagnosis not present

## 2016-07-13 DIAGNOSIS — R2681 Unsteadiness on feet: Secondary | ICD-10-CM | POA: Diagnosis not present

## 2016-07-13 DIAGNOSIS — M6281 Muscle weakness (generalized): Secondary | ICD-10-CM | POA: Diagnosis not present

## 2016-07-13 DIAGNOSIS — R293 Abnormal posture: Secondary | ICD-10-CM | POA: Diagnosis not present
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
## 2016-07-15 ENCOUNTER — Ambulatory Visit: Payer: Medicare Other | Admitting: Physical Therapy

## 2016-07-15 DIAGNOSIS — R2689 Other abnormalities of gait and mobility: Secondary | ICD-10-CM

## 2016-07-15 DIAGNOSIS — R293 Abnormal posture: Secondary | ICD-10-CM | POA: Diagnosis not present

## 2016-07-15 DIAGNOSIS — M6281 Muscle weakness (generalized): Secondary | ICD-10-CM | POA: Diagnosis not present

## 2016-07-15 DIAGNOSIS — R2681 Unsteadiness on feet: Secondary | ICD-10-CM | POA: Diagnosis not present
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
## 2016-07-15 NOTE — Patient Instructions (Signed)
Sit to Stand Transfers:  1. Scoot out to the edge of the chair 2. Place your feet flat on the floor, shoulder width apart.  Make sure your feet are tucked just under your knees. 3. Lean forward (nose over toes) with momentum, and stand up tall with your best posture.  If you need to use your arms, use them as a quick boost up to stand. 4. If you are in a low or soft chair, you can lean back and then forward up to stand, in order to get more momentum. 5. Once you are standing, make sure you are looking ahead and standing tall.  To sit down:  1. Back up until you feel the chair behind your legs. 2. Bend at you hips, reaching  Back for you chair, if needed, then slowly squat to sit down on your chair. 

## 2016-07-18 ENCOUNTER — Ambulatory Visit: Payer: Medicare Other | Admitting: Physical Therapy

## 2016-07-19 ENCOUNTER — Ambulatory Visit: Payer: Medicare Other | Admitting: Physical Therapy

## 2016-07-19 DIAGNOSIS — R293 Abnormal posture: Secondary | ICD-10-CM | POA: Diagnosis not present

## 2016-07-19 DIAGNOSIS — M6281 Muscle weakness (generalized): Secondary | ICD-10-CM | POA: Diagnosis not present

## 2016-07-19 DIAGNOSIS — R2681 Unsteadiness on feet: Secondary | ICD-10-CM

## 2016-07-19 DIAGNOSIS — R2689 Other abnormalities of gait and mobility: Secondary | ICD-10-CM | POA: Diagnosis not present

## 2016-07-19 NOTE — Patient Instructions (Signed)
Provided PWR! Moves in Standing at counter for support if needed 1x/day x 20 reps of each basic 4 moves
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
## 2016-07-21 ENCOUNTER — Ambulatory Visit: Payer: Medicare Other | Admitting: Physical Therapy

## 2016-07-25 ENCOUNTER — Ambulatory Visit: Payer: Medicare Other | Admitting: Rehabilitation

## 2016-07-25 ENCOUNTER — Encounter: Payer: Self-pay | Admitting: Rehabilitation

## 2016-07-25 DIAGNOSIS — R2689 Other abnormalities of gait and mobility: Secondary | ICD-10-CM

## 2016-07-25 DIAGNOSIS — R293 Abnormal posture: Secondary | ICD-10-CM

## 2016-07-25 DIAGNOSIS — R2681 Unsteadiness on feet: Secondary | ICD-10-CM | POA: Diagnosis not present

## 2016-07-25 DIAGNOSIS — M6281 Muscle weakness (generalized): Secondary | ICD-10-CM | POA: Diagnosis not present
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
## 2016-07-28 ENCOUNTER — Ambulatory Visit: Payer: Medicare Other | Admitting: Physical Therapy

## 2016-07-28 DIAGNOSIS — M6281 Muscle weakness (generalized): Secondary | ICD-10-CM | POA: Diagnosis not present

## 2016-07-28 DIAGNOSIS — R293 Abnormal posture: Secondary | ICD-10-CM | POA: Diagnosis not present

## 2016-07-28 DIAGNOSIS — R2689 Other abnormalities of gait and mobility: Secondary | ICD-10-CM | POA: Diagnosis not present

## 2016-07-28 DIAGNOSIS — R2681 Unsteadiness on feet: Secondary | ICD-10-CM
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
## 2016-08-01 ENCOUNTER — Ambulatory Visit: Payer: Medicare Other | Admitting: Physical Therapy

## 2016-08-01 ENCOUNTER — Encounter: Payer: Self-pay | Admitting: Physical Therapy

## 2016-08-01 DIAGNOSIS — R293 Abnormal posture: Secondary | ICD-10-CM

## 2016-08-01 DIAGNOSIS — R2681 Unsteadiness on feet: Secondary | ICD-10-CM | POA: Diagnosis not present

## 2016-08-01 DIAGNOSIS — R2689 Other abnormalities of gait and mobility: Secondary | ICD-10-CM | POA: Diagnosis not present

## 2016-08-01 DIAGNOSIS — M6281 Muscle weakness (generalized): Secondary | ICD-10-CM | POA: Diagnosis not present
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
## 2016-08-04 ENCOUNTER — Ambulatory Visit: Payer: Medicare Other | Attending: Family Medicine | Admitting: Physical Therapy

## 2016-08-04 DIAGNOSIS — R293 Abnormal posture: Secondary | ICD-10-CM | POA: Diagnosis not present

## 2016-08-04 DIAGNOSIS — R2681 Unsteadiness on feet: Secondary | ICD-10-CM

## 2016-08-04 DIAGNOSIS — R2689 Other abnormalities of gait and mobility: Secondary | ICD-10-CM
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
## 2016-08-08 ENCOUNTER — Ambulatory Visit: Payer: Medicare Other | Admitting: Physical Therapy

## 2016-08-08 ENCOUNTER — Encounter: Payer: Self-pay | Admitting: Physical Therapy

## 2016-08-08 DIAGNOSIS — R2681 Unsteadiness on feet: Secondary | ICD-10-CM

## 2016-08-08 DIAGNOSIS — R293 Abnormal posture: Secondary | ICD-10-CM | POA: Diagnosis not present

## 2016-08-08 DIAGNOSIS — R2689 Other abnormalities of gait and mobility: Secondary | ICD-10-CM | POA: Diagnosis not present

## 2016-08-08 NOTE — Patient Instructions (Addendum)
Tandem Stance    Right foot in front of left, heel touching toe both feet "straight ahead". Stand on Foot Triangle of Support with both feet. Balance in this position _30__ seconds. Do with left foot in front of right. PROGRESS with visual scanning. Then progress with walking forward.  Copyright  VHI. All rights reserved.  SINGLE LIMB STANCE    Stance: single leg on floor. Raise leg. Hold _15__ seconds. Repeat with other leg. __3_ reps per set.  PROGRESS: 1.try on different surfaces 2. Marching forward. Copyright  VHI. All rights reserved.
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
## 2016-08-10 ENCOUNTER — Ambulatory Visit: Payer: Medicare Other | Admitting: Physical Therapy

## 2016-08-10 DIAGNOSIS — R293 Abnormal posture: Secondary | ICD-10-CM

## 2016-08-10 DIAGNOSIS — R2689 Other abnormalities of gait and mobility: Secondary | ICD-10-CM

## 2016-08-10 DIAGNOSIS — R2681 Unsteadiness on feet: Secondary | ICD-10-CM | POA: Diagnosis not present

## 2016-08-10 NOTE — Patient Instructions (Signed)
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
## 2016-08-15 ENCOUNTER — Ambulatory Visit: Payer: Medicare Other | Admitting: Physical Therapy

## 2016-08-17 ENCOUNTER — Ambulatory Visit: Payer: Medicare Other | Admitting: Physical Therapy

## 2016-08-22 ENCOUNTER — Ambulatory Visit (INDEPENDENT_AMBULATORY_CARE_PROVIDER_SITE_OTHER): Payer: Medicare Other | Admitting: Neurology

## 2016-08-22 ENCOUNTER — Encounter: Payer: Self-pay | Admitting: Neurology

## 2016-08-22 ENCOUNTER — Telehealth: Payer: Self-pay | Admitting: Neurology

## 2016-08-22 DIAGNOSIS — G20 Parkinson's disease: Secondary | ICD-10-CM | POA: Diagnosis not present

## 2016-08-22 MED ORDER — CARBIDOPA-LEVODOPA 25-100 MG PO TABS: 1.0000 | ORAL_TABLET | Freq: Four times a day (QID) | ORAL | 3 refills | Status: DC

## 2016-08-22 MED ORDER — PRAMIPEXOLE DIHYDROCHLORIDE 0.25 MG PO TABS: 0.2500 mg | ORAL_TABLET | Freq: Three times a day (TID) | ORAL | 5 refills | Status: DC

## 2016-08-22 MED ORDER — SELEGILINE HCL 5 MG PO CAPS: ORAL_CAPSULE | ORAL | 3 refills | Status: DC

## 2016-08-22 NOTE — Patient Instructions (Signed)
Apomorphine injection What is this medicine? APOMORPHINE (a poe MOR feen) is used to treat 'off' episodes in advanced Parkinson's disease. These episodes affect your ability to move or perform tasks. This medicine may be used for other purposes; ask your health care provider or pharmacist if you have questions. COMMON BRAND NAME(S): Apokyn What should I tell my health care provider before I take this medicine? They need to know if you have any of these conditions: -asthma or other breathing problems -heart disease -history of alcohol or drug abuse -kidney or liver disease -low blood pressure -mental illness -sleep disorder -stroke -an unusual or allergic reaction to apomorphine, sulfites, other medicines foods, dyes, or preservatives -pregnant or trying to get pregnant -breast-feeding How should I use this medicine? This medicine is for injection under the skin. You will be taught how to prepare and give this medicine. You will also need to take a medicine prescribed by your doctor to prevent nausea and vomiting when you first start treatment. Use exactly as directed. Do not take your medicine more often than directed. Do not stop taking except on your doctor's advice. Stopping this medicine too quickly may cause serious side effects. It is important that you put your used needles and syringes in a special sharps container. Do not put them in a trash can. If you do not have a sharps container, call your pharmacist or healthcare provider to get one. Talk to your pediatrician regarding the use of this medicine in children. Special care may be needed. Overdosage: If you think you have taken too much of this medicine contact a poison control center or emergency room at once. NOTE: This medicine is only for you. Do not share this medicine with others. What if I miss a dose? This does not apply. This medicine is only given as needed to treat 'off' episodes in Parkinson's disease. Contact your  health care provider if your symptoms do not respond to the first dose for a particular 'off' episode. Do not use a second dose for that episode. Do not use double or extra doses. What may interact with this medicine? Do not take this medicine with any of the following medications: -certain antibiotics like grepafloxacin and sparfloxacin -cisapride -medicines for irregular heart beat like amiodarone, disopyramide, dofetilide, flecainide, ibutilide, procainamide, quinidine, sotalol -droperidol -halofantrine -levomethadyl -pimozide -some medicines for nausea like alosetron, dolasetron, dronabinol, droperidol, granisetron, ondansetron, palonosetron -ziprasidone This medicine may also interact with the following medications: -alfuzosin -certain antibiotics like clarithromycin, erythromycin, gatifloxacin, gemifloxacin, levofloxacin, moxifloxacin, troleandomycin -medicines for high blood pressure or chest pain (angina) -medicines to treat or prevent malaria like chloroquine or mefloquine -metoclopramide -phenothiazines like chlorpromazine, mesoridazine, prochlorperazine, thioridazine -some medicines for depression like amitriptyline, amoxapine, maprotiline, mirtazapine, nefazodone, nortriptyline -some medicines for mental disturbances like clozapine, haloperidol, molindone, olanzapine, pimozide, quetiapine, risperidone, ziprasidone This list may not describe all possible interactions. Give your health care provider a list of all the medicines, herbs, non-prescription drugs, or dietary supplements you use. Also tell them if you smoke, drink alcohol, or use illegal drugs. Some items may interact with your medicine. What should I watch for while using this medicine? Visit your doctor or health care professional for regular checks on your progress. You may get drowsy or dizzy. Do not drive, use machinery, or do anything that needs mental alertness until you know how this medicine affects you. This  medicine may cause a sudden drop in blood pressure with or without symptoms such as nausea, dizziness, feeling faint, or  sweating, especially when you first start treatment or after an increase in dose. Do not get up too quickly from a lying or sitting position. A drop in blood pressure may increase the risk for falling. Report any dizziness or related symptoms to your health care provider as soon as possible. Limit alcoholic drinks. Alcohol may increase the risk of dizziness and drowsiness. Do not take any medications that cause drowsiness without first checking with your health care provider. If you find that you have sudden feelings of wanting to sleep during normal activities, like cooking, watching television, or while driving or riding in a car, you should contact your health care professional. This medicine may cause severe nausea and vomiting. Your doctor may prescribe a medication to prevent these symptoms. Do not treat yourself. Not all medicines for nausea and vomiting can be used with this medicine. Talk to your doctor about which one may be right for you. There have been reports of increased sexual urges or other strong urges such as gambling while taking some medicines for Parkinson's disease. If you experience any of these urges while taking this medicine, you should report it to your health care provider as soon as possible. You should check your skin often for changes to moles and new growths while taking this medicine. Call your doctor if you notice any of these changes. What side effects may I notice from receiving this medicine? Side effects that you should report to your doctor or health care professional as soon as possible: -allergic reactions like skin rash, itching or hives, swelling of the face, lips, or tongue -breathing problems -chest pain -confusion -falling asleep during normal activities like driving -feeling faint or lightheaded, falls -hallucination, loss of contact  with reality -increased sweating -males: prolonged or painful erection -signs and symptoms of a dangerous change in heartbeat or heart rhythm like chest pain; dizziness; fast or irregular heartbeat; palpitations; feeling faint or lightheaded, falls; breathing problems -signs and symptoms of low blood pressure like dizziness; feeling faint or lightheaded, falls; unusually weak or tired -swelling in arms, hands, legs, or feet -uncontrollable and excessive urges (examples: gambling, binge eating, shopping, having sex) -uncontrollable head, mouth, neck, arm, or leg movements -vomiting Side effects that usually do not require medical attention (report to your doctor or health care professional if they continue or are bothersome) -drowsiness -headache -nausea -pain, redness, or irritation at site where injected -runny nose -yawning This list may not describe all possible side effects. Call your doctor for medical advice about side effects. You may report side effects to FDA at 1-800-FDA-1088. Where should I keep my medicine? Keep out of the reach of children. Store at room temperature between 15 and 30 degrees C (59 and 86 degrees). Throw away any unused medicine after the expiration date. NOTE: This sheet is a summary. It may not cover all possible information. If you have questions about this medicine, talk to your doctor, pharmacist, or health care provider.  2017 [employer] Standard (2015-12-09 20:52:17)
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
## 2016-08-24 DIAGNOSIS — R932 Abnormal findings on diagnostic imaging of liver and biliary tract: Secondary | ICD-10-CM | POA: Diagnosis not present

## 2016-08-24 DIAGNOSIS — N5201 Erectile dysfunction due to arterial insufficiency: Secondary | ICD-10-CM | POA: Diagnosis not present

## 2016-08-24 DIAGNOSIS — N3000 Acute cystitis without hematuria: Secondary | ICD-10-CM | POA: Diagnosis not present

## 2016-08-24 DIAGNOSIS — N430 Encysted hydrocele: Secondary | ICD-10-CM | POA: Diagnosis not present

## 2016-08-24 DIAGNOSIS — N35013 Post-traumatic anterior urethral stricture: Secondary | ICD-10-CM | POA: Diagnosis not present

## 2016-08-25 ENCOUNTER — Other Ambulatory Visit: Payer: Self-pay | Admitting: Urology

## 2016-08-25 DIAGNOSIS — R932 Abnormal findings on diagnostic imaging of liver and biliary tract: Secondary | ICD-10-CM

## 2016-09-01 ENCOUNTER — Ambulatory Visit (HOSPITAL_COMMUNITY)
Admission: RE | Admit: 2016-09-01 | Discharge: 2016-09-01 | Disposition: A | Discharge status: Home / Self Care | Payer: Medicare Other | Source: Ambulatory Visit | Attending: Urology | Admitting: Urology

## 2016-09-01 DIAGNOSIS — K7689 Other specified diseases of liver: Secondary | ICD-10-CM | POA: Insufficient documentation

## 2016-09-01 DIAGNOSIS — R932 Abnormal findings on diagnostic imaging of liver and biliary tract: Secondary | ICD-10-CM

## 2016-09-01 LAB — POCT I-STAT CREATININE: Creatinine, Ser: 0.8 mg/dL (ref 0.61–1.24)

## 2016-09-01 MED ORDER — GADOBENATE DIMEGLUMINE 529 MG/ML IV SOLN
15.0000 mL | Freq: Once | INTRAVENOUS | Status: AC | PRN
  Administered 2016-09-01: 14 mL via INTRAVENOUS

## 2016-09-02 DIAGNOSIS — E7800 Pure hypercholesterolemia, unspecified: Secondary | ICD-10-CM | POA: Diagnosis not present

## 2016-09-02 DIAGNOSIS — R945 Abnormal results of liver function studies: Secondary | ICD-10-CM | POA: Diagnosis not present

## 2016-09-02 DIAGNOSIS — R3915 Urgency of urination: Secondary | ICD-10-CM | POA: Diagnosis not present

## 2016-10-11 DIAGNOSIS — Z79899 Other long term (current) drug therapy: Secondary | ICD-10-CM | POA: Diagnosis not present

## 2016-10-11 DIAGNOSIS — G20 Parkinson's disease: Secondary | ICD-10-CM | POA: Diagnosis not present

## 2016-10-18 DIAGNOSIS — G20 Parkinson's disease: Secondary | ICD-10-CM | POA: Diagnosis not present

## 2016-10-18 DIAGNOSIS — H903 Sensorineural hearing loss, bilateral: Secondary | ICD-10-CM | POA: Diagnosis not present

## 2016-10-18 DIAGNOSIS — H60391 Other infective otitis externa, right ear: Secondary | ICD-10-CM | POA: Diagnosis not present

## 2016-10-18 DIAGNOSIS — H6983 Other specified disorders of Eustachian tube, bilateral: Secondary | ICD-10-CM | POA: Diagnosis not present

## 2016-11-21 DIAGNOSIS — H903 Sensorineural hearing loss, bilateral: Secondary | ICD-10-CM | POA: Diagnosis not present

## 2016-11-21 DIAGNOSIS — H6983 Other specified disorders of Eustachian tube, bilateral: Secondary | ICD-10-CM | POA: Diagnosis not present

## 2016-11-21 DIAGNOSIS — G20 Parkinson's disease: Secondary | ICD-10-CM | POA: Diagnosis not present

## 2016-11-22 ENCOUNTER — Ambulatory Visit (INDEPENDENT_AMBULATORY_CARE_PROVIDER_SITE_OTHER): Payer: Medicare Other | Admitting: Adult Health

## 2016-11-22 ENCOUNTER — Encounter: Payer: Self-pay | Admitting: Adult Health

## 2016-11-22 ENCOUNTER — Encounter (INDEPENDENT_AMBULATORY_CARE_PROVIDER_SITE_OTHER): Payer: Self-pay

## 2016-11-22 ENCOUNTER — Other Ambulatory Visit: Payer: Self-pay | Admitting: Otolaryngology

## 2016-11-22 VITALS — BP 96/63 | HR 74 | Ht 68.0 in | Wt 154.0 lb

## 2016-11-22 DIAGNOSIS — G20 Parkinson's disease: Secondary | ICD-10-CM

## 2016-11-22 DIAGNOSIS — H6983 Other specified disorders of Eustachian tube, bilateral: Secondary | ICD-10-CM

## 2016-11-22 DIAGNOSIS — G479 Sleep disorder, unspecified: Secondary | ICD-10-CM | POA: Diagnosis not present

## 2016-11-22 DIAGNOSIS — H903 Sensorineural hearing loss, bilateral: Secondary | ICD-10-CM
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
## 2016-11-22 NOTE — Patient Instructions (Signed)
Continue current medication regimen   Sinemet, 25/100, 1 pill 5 times a day Selegiline, 5 mg twice daily Mirapex, 0.25 mg 3 times daily    Can try OTC melatonin 1mg  -3 mg 2 hours before bedtime  Nocturia ( urination at night) is a common compliant with parkinson's disease

## 2016-11-23 NOTE — Progress Notes (Signed)
I agree with the assessment and plan as directed by NP .The patient is known to me .   Laramie Gelles, MD  

## 2016-11-30 ENCOUNTER — Ambulatory Visit
Admission: RE | Admit: 2016-11-30 | Discharge: 2016-11-30 | Disposition: A | Discharge status: Home / Self Care | Payer: Medicare Other | Source: Ambulatory Visit | Attending: Otolaryngology | Admitting: Otolaryngology

## 2016-11-30 DIAGNOSIS — H903 Sensorineural hearing loss, bilateral: Secondary | ICD-10-CM

## 2016-11-30 DIAGNOSIS — G20A1 Parkinson's disease without dyskinesia, without mention of fluctuations: Secondary | ICD-10-CM

## 2016-11-30 DIAGNOSIS — H6993 Unspecified Eustachian tube disorder, bilateral: Secondary | ICD-10-CM

## 2016-11-30 DIAGNOSIS — H9193 Unspecified hearing loss, bilateral: Secondary | ICD-10-CM | POA: Diagnosis not present

## 2016-11-30 DIAGNOSIS — H6983 Other specified disorders of Eustachian tube, bilateral: Secondary | ICD-10-CM

## 2016-11-30 DIAGNOSIS — G20 Parkinson's disease: Secondary | ICD-10-CM

## 2016-11-30 MED ORDER — IOPAMIDOL (ISOVUE-300) INJECTION 61%
75.0000 mL | Freq: Once | INTRAVENOUS | Status: AC | PRN
  Administered 2016-11-30: 75 mL via INTRAVENOUS

## 2016-12-12 DIAGNOSIS — H60391 Other infective otitis externa, right ear: Secondary | ICD-10-CM | POA: Diagnosis not present

## 2016-12-12 DIAGNOSIS — H6983 Other specified disorders of Eustachian tube, bilateral: Secondary | ICD-10-CM | POA: Diagnosis not present

## 2016-12-12 DIAGNOSIS — H903 Sensorineural hearing loss, bilateral: Secondary | ICD-10-CM | POA: Diagnosis not present

## 2016-12-12 DIAGNOSIS — G20 Parkinson's disease: Secondary | ICD-10-CM | POA: Diagnosis not present

## 2016-12-12 NOTE — Telephone Encounter (Signed)
error 

## 2017-01-03 DIAGNOSIS — H6983 Other specified disorders of Eustachian tube, bilateral: Secondary | ICD-10-CM | POA: Diagnosis not present

## 2017-01-03 DIAGNOSIS — H903 Sensorineural hearing loss, bilateral: Secondary | ICD-10-CM | POA: Diagnosis not present

## 2017-01-03 DIAGNOSIS — H60391 Other infective otitis externa, right ear: Secondary | ICD-10-CM | POA: Diagnosis not present

## 2017-01-03 DIAGNOSIS — G20 Parkinson's disease: Secondary | ICD-10-CM | POA: Diagnosis not present

## 2017-01-23 ENCOUNTER — Telehealth: Payer: Self-pay | Admitting: Neurology

## 2017-01-23 DIAGNOSIS — R109 Unspecified abdominal pain: Secondary | ICD-10-CM | POA: Diagnosis not present

## 2017-01-23 DIAGNOSIS — R079 Chest pain, unspecified: Secondary | ICD-10-CM | POA: Diagnosis not present

## 2017-01-23 NOTE — Telephone Encounter (Signed)
Pt's wife called the office said he is having intermittent pain under the rib cage in the middle of his chest for the past 2-3 wks. He has slept on the floor for comfort. Patient denies any heart problems

## 2017-01-23 NOTE — Telephone Encounter (Signed)
Called and spoke with pt. They are unable to get him in with primary care MD. I have spoken with wife and encouraged to be seen in urgent care or ED. I spoke with Dr Brett Fairy and she also made same recommendation. Dr Dohmeier did intervene and help with talking about this with the pt.

## 2017-02-09 DIAGNOSIS — T161XXA Foreign body in right ear, initial encounter: Secondary | ICD-10-CM | POA: Diagnosis not present

## 2017-02-10 DIAGNOSIS — H903 Sensorineural hearing loss, bilateral: Secondary | ICD-10-CM | POA: Diagnosis not present

## 2017-02-10 DIAGNOSIS — T161XXA Foreign body in right ear, initial encounter: Secondary | ICD-10-CM | POA: Diagnosis not present

## 2017-02-27 DIAGNOSIS — R109 Unspecified abdominal pain: Secondary | ICD-10-CM | POA: Diagnosis not present

## 2017-02-27 DIAGNOSIS — L570 Actinic keratosis: Secondary | ICD-10-CM | POA: Diagnosis not present

## 2017-02-27 DIAGNOSIS — K469 Unspecified abdominal hernia without obstruction or gangrene: Secondary | ICD-10-CM | POA: Diagnosis not present

## 2017-03-13 DIAGNOSIS — F419 Anxiety disorder, unspecified: Secondary | ICD-10-CM | POA: Diagnosis not present

## 2017-03-13 DIAGNOSIS — R945 Abnormal results of liver function studies: Secondary | ICD-10-CM | POA: Diagnosis not present

## 2017-03-13 DIAGNOSIS — I499 Cardiac arrhythmia, unspecified: Secondary | ICD-10-CM | POA: Diagnosis not present

## 2017-03-13 DIAGNOSIS — E7800 Pure hypercholesterolemia, unspecified: Secondary | ICD-10-CM | POA: Diagnosis not present

## 2017-03-13 DIAGNOSIS — Z23 Encounter for immunization: Secondary | ICD-10-CM | POA: Diagnosis not present

## 2017-03-13 DIAGNOSIS — L570 Actinic keratosis: Secondary | ICD-10-CM | POA: Diagnosis not present

## 2017-03-13 DIAGNOSIS — R3915 Urgency of urination: Secondary | ICD-10-CM | POA: Diagnosis not present

## 2017-03-13 DIAGNOSIS — J329 Chronic sinusitis, unspecified: Secondary | ICD-10-CM | POA: Diagnosis not present

## 2017-03-23 DIAGNOSIS — N509 Disorder of male genital organs, unspecified: Secondary | ICD-10-CM | POA: Diagnosis not present

## 2017-03-24 ENCOUNTER — Other Ambulatory Visit: Payer: Self-pay | Admitting: Surgery

## 2017-03-24 DIAGNOSIS — N5089 Other specified disorders of the male genital organs: Secondary | ICD-10-CM

## 2017-03-27 ENCOUNTER — Telehealth: Payer: Self-pay | Admitting: *Deleted

## 2017-03-27 ENCOUNTER — Other Ambulatory Visit: Payer: Self-pay | Admitting: Surgery

## 2017-03-27 ENCOUNTER — Other Ambulatory Visit: Payer: Self-pay | Admitting: Neurology

## 2017-03-27 DIAGNOSIS — N5089 Other specified disorders of the male genital organs: Secondary | ICD-10-CM

## 2017-03-27 DIAGNOSIS — G20 Parkinson's disease: Secondary | ICD-10-CM

## 2017-03-27 MED ORDER — CARBIDOPA-LEVODOPA 25-100 MG PO TABS: 1.0000 | ORAL_TABLET | Freq: Every day | ORAL | 0 refills | Status: DC

## 2017-03-27 NOTE — Telephone Encounter (Signed)
Called the pharmacy back and left a message for Loletha Grayer stating that it could be just 5 times a day and he could disregard the times that were listed as they were from previous order and did not get deleted. They were at lunch when I called so left a voicemail to call me back if needed to discuss further.

## 2017-03-27 NOTE — Telephone Encounter (Signed)
Received call from [facility] with questions regarding Carb-Levo prescription sent in today. He needs clarification to dose schedule.  This RN was unable to help Loletha Grayer, advised would route message to [employer] who sent in Rx. Advised him she will call him back. He stated it is not urgent, verbalized appreciation.

## 2017-03-28 ENCOUNTER — Ambulatory Visit: Payer: Medicare Other | Admitting: Physical Therapy

## 2017-03-28 ENCOUNTER — Ambulatory Visit: Payer: Medicare Other

## 2017-03-28 ENCOUNTER — Ambulatory Visit: Payer: Medicare Other | Admitting: Occupational Therapy

## 2017-03-29 ENCOUNTER — Ambulatory Visit
Admission: RE | Admit: 2017-03-29 | Discharge: 2017-03-29 | Disposition: A | Discharge status: Home / Self Care | Payer: Medicare Other | Source: Ambulatory Visit | Attending: Surgery | Admitting: Surgery

## 2017-03-29 DIAGNOSIS — N5089 Other specified disorders of the male genital organs: Secondary | ICD-10-CM

## 2017-03-29 DIAGNOSIS — N503 Cyst of epididymis: Secondary | ICD-10-CM | POA: Diagnosis not present

## 2017-04-03 DIAGNOSIS — N430 Encysted hydrocele: Secondary | ICD-10-CM | POA: Diagnosis not present

## 2017-04-05 ENCOUNTER — Other Ambulatory Visit: Payer: Self-pay | Admitting: Urology

## 2017-04-06 NOTE — Patient Instructions (Addendum)
Tim [name]  04/06/2017   Your procedure is scheduled on: Monday 04/10/2017  Report to [name]  Entrance Take [name]  elevators to 3rd floor to  [name] at   [name].    Call this number if you have problems the morning of surgery [phone]    Remember: ONLY 1 PERSON MAY GO WITH YOU TO SHORT STAY TO GET  READY MORNING OF [name].   Do not eat food or drink liquids :After Midnight.     Take these medicines the morning of surgery with A SIP OF WATER: Sinemet, Mirapex, Eldepryl                                You may not have any metal on your body including hair pins and              piercings  Do not wear jewelry, make-up, lotions, powders or perfumes, deodorant             Do not wear nail polish.  Do not shave  48 hours prior to surgery.              Men may shave face and neck.   Do not bring valuables to the hospital. [name].  Contacts, dentures or bridgework may not be worn into surgery.  Leave suitcase in the car. After surgery it may be brought to your room.     Patients discharged the day of surgery will not be allowed to drive home.  Name and phone number of your driver:wife Tim [name] cell [phone]  Special Instructions: N/A              Please read over the following fact sheets you were given: _____________________________________________________________________             [name] Before surgery, you can play an important role.  Because skin is not sterile, your skin needs to be as free of germs as possible.  You can reduce the number of germs on your skin by washing with CHG (chlorahexidine gluconate) soap before surgery.  CHG is an antiseptic cleaner which kills germs and bonds with the skin to continue killing germs even after washing. Please DO NOT use if you have an allergy to CHG or antibacterial soaps.  If your skin becomes  reddened/irritated stop using the CHG and inform your nurse when you arrive at Short Stay. Do not shave (including legs and underarms) for at least 48 hours prior to the first CHG shower.  You may shave your face/neck. Please follow these instructions carefully:  1.  Shower with CHG Soap the night before surgery and the  morning of Surgery.  2.  If you choose to wash your hair, wash your hair first as usual with your  normal  shampoo.  3.  After you shampoo, rinse your hair and body thoroughly to remove the  shampoo.                           4.  Use CHG as you would any other liquid soap.  You can apply chg directly  to the skin and  wash                       Gently with a scrungie or clean washcloth.  5.  Apply the CHG Soap to your body ONLY FROM THE NECK DOWN.   Do not use on face/ open                           Wound or open sores. Avoid contact with eyes, ears mouth and genitals (private parts).                       Wash face,  Genitals (private parts) with your normal soap.             6.  Wash thoroughly, paying special attention to the area where your surgery  will be performed.  7.  Thoroughly rinse your body with warm water from the neck down.  8.  DO NOT shower/wash with your normal soap after using and rinsing off  the CHG Soap.                9.  Pat yourself dry with a clean towel.            10.  Wear clean pajamas.            11.  Place clean sheets on your bed the night of your first shower and do not  sleep with pets. Day of Surgery : Do not apply any lotions/deodorants the morning of surgery.  Please wear clean clothes to the hospital/surgery center.  FAILURE TO FOLLOW THESE INSTRUCTIONS MAY RESULT IN THE CANCELLATION OF YOUR SURGERY PATIENT SIGNATURE_________________________________  NURSE SIGNATURE__________________________________  ________________________________________________________________________

## 2017-04-07 ENCOUNTER — Encounter (HOSPITAL_COMMUNITY)
Admission: RE | Admit: 2017-04-07 | Discharge: 2017-04-07 | Disposition: A | Discharge status: Home / Self Care | Payer: Medicare Other | Source: Ambulatory Visit | Attending: Urology | Admitting: Urology

## 2017-04-07 ENCOUNTER — Encounter (HOSPITAL_COMMUNITY): Payer: Self-pay

## 2017-04-07 DIAGNOSIS — N433 Hydrocele, unspecified: Secondary | ICD-10-CM | POA: Diagnosis not present

## 2017-04-07 DIAGNOSIS — F419 Anxiety disorder, unspecified: Secondary | ICD-10-CM | POA: Diagnosis not present

## 2017-04-07 DIAGNOSIS — E7800 Pure hypercholesterolemia, unspecified: Secondary | ICD-10-CM | POA: Diagnosis not present

## 2017-04-07 DIAGNOSIS — G20 Parkinson's disease: Secondary | ICD-10-CM | POA: Diagnosis not present

## 2017-04-07 DIAGNOSIS — N400 Enlarged prostate without lower urinary tract symptoms: Secondary | ICD-10-CM | POA: Diagnosis not present

## 2017-04-07 DIAGNOSIS — L409 Psoriasis, unspecified: Secondary | ICD-10-CM | POA: Diagnosis not present

## 2017-04-07 HISTORY — DX: Unspecified hearing loss, unspecified ear: H91.90

## 2017-04-07 HISTORY — DX: Adverse effect of unspecified anesthetic, initial encounter: T41.45XA

## 2017-04-07 HISTORY — DX: Other complications of anesthesia, initial encounter: T88.59XA

## 2017-04-07 HISTORY — DX: Psoriasis, unspecified: L40.9

## 2017-04-07 HISTORY — DX: Hydrocele, unspecified: N43.3

## 2017-04-07 LAB — CBC
HEMATOCRIT: 44 % (ref 39.0–52.0)
Hemoglobin: 14.6 g/dL (ref 13.0–17.0)
MCH: 29.6 pg (ref 26.0–34.0)
MCHC: 33.2 g/dL (ref 30.0–36.0)
MCV: 89.1 fL (ref 78.0–100.0)
PLATELETS: 229 10*3/uL (ref 150–400)
RBC: 4.94 MIL/uL (ref 4.22–5.81)
RDW: 13.8 % (ref 11.5–15.5)
WBC: 6.9 10*3/uL (ref 4.0–10.5)

## 2017-04-07 LAB — BASIC METABOLIC PANEL
Anion gap: 11 (ref 5–15)
BUN: 14 mg/dL (ref 6–20)
CALCIUM: 9.4 mg/dL (ref 8.9–10.3)
CO2: 25 mmol/L (ref 22–32)
CREATININE: 0.84 mg/dL (ref 0.61–1.24)
Chloride: 104 mmol/L (ref 101–111)
Glucose, Bld: 96 mg/dL (ref 65–99)
Potassium: 4 mmol/L (ref 3.5–5.1)
Sodium: 140 mmol/L (ref 135–145)

## 2017-04-10 ENCOUNTER — Encounter (HOSPITAL_COMMUNITY)
Admission: RE | Disposition: A | Discharge status: Home / Self Care | Payer: Self-pay | Source: Ambulatory Visit | Attending: Urology

## 2017-04-10 ENCOUNTER — Ambulatory Visit (HOSPITAL_COMMUNITY): Payer: Medicare Other | Admitting: Certified Registered Nurse Anesthetist

## 2017-04-10 ENCOUNTER — Encounter (HOSPITAL_COMMUNITY): Payer: Self-pay | Admitting: *Deleted

## 2017-04-10 ENCOUNTER — Ambulatory Visit (HOSPITAL_COMMUNITY)
Admission: RE | Admit: 2017-04-10 | Discharge: 2017-04-10 | Disposition: A | Discharge status: Home / Self Care | Payer: Medicare Other | Source: Ambulatory Visit | Attending: Urology | Admitting: Urology

## 2017-04-10 DIAGNOSIS — E7800 Pure hypercholesterolemia, unspecified: Secondary | ICD-10-CM | POA: Diagnosis not present

## 2017-04-10 DIAGNOSIS — K635 Polyp of colon: Secondary | ICD-10-CM | POA: Diagnosis not present

## 2017-04-10 DIAGNOSIS — L409 Psoriasis, unspecified: Secondary | ICD-10-CM | POA: Diagnosis not present

## 2017-04-10 DIAGNOSIS — N430 Encysted hydrocele: Secondary | ICD-10-CM | POA: Diagnosis not present

## 2017-04-10 DIAGNOSIS — F419 Anxiety disorder, unspecified: Secondary | ICD-10-CM | POA: Insufficient documentation

## 2017-04-10 DIAGNOSIS — N400 Enlarged prostate without lower urinary tract symptoms: Secondary | ICD-10-CM | POA: Insufficient documentation

## 2017-04-10 DIAGNOSIS — N433 Hydrocele, unspecified: Secondary | ICD-10-CM | POA: Insufficient documentation

## 2017-04-10 DIAGNOSIS — G20 Parkinson's disease: Secondary | ICD-10-CM | POA: Insufficient documentation

## 2017-04-10 DIAGNOSIS — R490 Dysphonia: Secondary | ICD-10-CM | POA: Diagnosis not present

## 2017-04-10 HISTORY — PX: HYDROCELE EXCISION: SHX482

## 2017-04-10 SURGERY — HYDROCELECTOMY
Anesthesia: General | Laterality: Right

## 2017-04-10 MED ORDER — FENTANYL CITRATE (PF) 100 MCG/2ML IJ SOLN
INTRAMUSCULAR | Status: AC
  Filled 2017-04-10: qty 2

## 2017-04-10 MED ORDER — LIDOCAINE 2% (20 MG/ML) 5 ML SYRINGE
INTRAMUSCULAR | Status: AC
  Filled 2017-04-10: qty 5

## 2017-04-10 MED ORDER — ARTIFICIAL TEARS OP OINT
TOPICAL_OINTMENT | OPHTHALMIC | Status: AC
  Filled 2017-04-10: qty 3.5

## 2017-04-10 MED ORDER — FENTANYL CITRATE (PF) 100 MCG/2ML IJ SOLN
25.0000 ug | INTRAMUSCULAR | Status: DC | PRN
  Administered 2017-04-10 (×2): 50 ug via INTRAVENOUS

## 2017-04-10 MED ORDER — SENNA 8.6 MG PO TABS: 1.0000 | ORAL_TABLET | Freq: Every day | ORAL | 0 refills | Status: AC

## 2017-04-10 MED ORDER — LACTATED RINGERS IV SOLN
INTRAVENOUS | Status: DC | PRN
  Administered 2017-04-10: 07:00:00 via INTRAVENOUS

## 2017-04-10 MED ORDER — PROPOFOL 10 MG/ML IV BOLUS
INTRAVENOUS | Status: DC | PRN
Start: 2017-04-10 — End: 2017-04-10
  Administered 2017-04-10: 150 mg via INTRAVENOUS

## 2017-04-10 MED ORDER — MIDAZOLAM HCL 2 MG/2ML IJ SOLN
INTRAMUSCULAR | Status: AC
  Filled 2017-04-10: qty 2

## 2017-04-10 MED ORDER — DOCUSATE SODIUM 100 MG PO CAPS: 200.0000 mg | ORAL_CAPSULE | Freq: Two times a day (BID) | ORAL | 11 refills | Status: DC

## 2017-04-10 MED ORDER — CEFAZOLIN SODIUM-DEXTROSE 2-4 GM/100ML-% IV SOLN
2.0000 g | INTRAVENOUS | Status: AC
  Administered 2017-04-10: 2 g via INTRAVENOUS

## 2017-04-10 MED ORDER — BUPIVACAINE HCL (PF) 0.25 % IJ SOLN
INTRAMUSCULAR | Status: DC | PRN
  Administered 2017-04-10: 7 mL

## 2017-04-10 MED ORDER — PROPOFOL 10 MG/ML IV BOLUS
INTRAVENOUS | Status: AC
  Filled 2017-04-10: qty 20

## 2017-04-10 MED ORDER — PHENYLEPHRINE 40 MCG/ML (10ML) SYRINGE FOR IV PUSH (FOR BLOOD PRESSURE SUPPORT)
PREFILLED_SYRINGE | INTRAVENOUS | Status: DC | PRN
  Administered 2017-04-10 (×2): 80 ug via INTRAVENOUS

## 2017-04-10 MED ORDER — ONDANSETRON HCL 4 MG/2ML IJ SOLN
INTRAMUSCULAR | Status: AC
  Filled 2017-04-10: qty 2

## 2017-04-10 MED ORDER — OXYCODONE HCL 5 MG PO CAPS: 5.0000 mg | ORAL_CAPSULE | ORAL | 0 refills | Status: DC | PRN

## 2017-04-10 MED ORDER — LIDOCAINE 2% (20 MG/ML) 5 ML SYRINGE
INTRAMUSCULAR | Status: DC | PRN
  Administered 2017-04-10: 60 mg via INTRAVENOUS

## 2017-04-10 MED ORDER — OXYCODONE HCL 5 MG PO TABS: 5.0000 mg | ORAL_TABLET | ORAL | Status: DC | PRN

## 2017-04-10 MED ORDER — ONDANSETRON HCL 4 MG/2ML IJ SOLN
INTRAMUSCULAR | Status: DC | PRN
  Administered 2017-04-10: 4 mg via INTRAVENOUS

## 2017-04-10 MED ORDER — DEXAMETHASONE SODIUM PHOSPHATE 10 MG/ML IJ SOLN
INTRAMUSCULAR | Status: DC | PRN
  Administered 2017-04-10: 10 mg via INTRAVENOUS

## 2017-04-10 MED ORDER — OXYCODONE HCL 5 MG/5ML PO SOLN
5.0000 mg | Freq: Once | ORAL | Status: DC | PRN
  Filled 2017-04-10: qty 5

## 2017-04-10 MED ORDER — 0.9 % SODIUM CHLORIDE (POUR BTL) OPTIME
TOPICAL | Status: DC | PRN
  Administered 2017-04-10: 1000 mL

## 2017-04-10 MED ORDER — BUPIVACAINE HCL (PF) 0.25 % IJ SOLN
INTRAMUSCULAR | Status: AC
  Filled 2017-04-10: qty 30

## 2017-04-10 MED ORDER — FENTANYL CITRATE (PF) 100 MCG/2ML IJ SOLN
INTRAMUSCULAR | Status: DC | PRN
  Administered 2017-04-10: 50 ug via INTRAVENOUS

## 2017-04-10 MED ORDER — ONDANSETRON HCL 4 MG/2ML IJ SOLN: 4.0000 mg | Freq: Four times a day (QID) | INTRAMUSCULAR | Status: DC | PRN

## 2017-04-10 MED ORDER — OXYCODONE HCL 5 MG PO TABS: 5.0000 mg | ORAL_TABLET | Freq: Once | ORAL | Status: DC | PRN

## 2017-04-10 MED ORDER — CEFAZOLIN SODIUM-DEXTROSE 2-4 GM/100ML-% IV SOLN
INTRAVENOUS | Status: AC
  Filled 2017-04-10: qty 100

## 2017-04-10 MED ORDER — MIDAZOLAM HCL 5 MG/5ML IJ SOLN
INTRAMUSCULAR | Status: DC | PRN
  Administered 2017-04-10 (×2): 0.5 mg via INTRAVENOUS

## 2017-04-10 SURGICAL SUPPLY — 27 items
BLADE HEX COATED 2.75 (ELECTRODE) ×3 IMPLANT
BNDG GAUZE ELAST 4 BULKY (GAUZE/BANDAGES/DRESSINGS) ×3 IMPLANT
COVER SURGICAL LIGHT HANDLE (MISCELLANEOUS) ×3 IMPLANT
DERMABOND ADVANCED (GAUZE/BANDAGES/DRESSINGS) ×2
DERMABOND ADVANCED .7 DNX12 (GAUZE/BANDAGES/DRESSINGS) ×1 IMPLANT
DRAIN PENROSE 18X1/4 LTX STRL (WOUND CARE) ×3 IMPLANT
DRAPE LAPAROTOMY T 98X78 PEDS (DRAPES) ×3 IMPLANT
ELECT REM PT RETURN 15FT ADLT (MISCELLANEOUS) ×3 IMPLANT
GLOVE BIOGEL M 8.0 STRL (GLOVE) IMPLANT
GOWN STRL REUS W/ TWL XL LVL3 (GOWN DISPOSABLE) ×1 IMPLANT
GOWN STRL REUS W/TWL XL LVL3 (GOWN DISPOSABLE) ×5 IMPLANT
KIT BASIN OR (CUSTOM PROCEDURE TRAY) ×3 IMPLANT
NEEDLE HYPO 22GX1.5 SAFETY (NEEDLE) ×3 IMPLANT
NS IRRIG 1000ML POUR BTL (IV SOLUTION) IMPLANT
PACK GENERAL/GYN (CUSTOM PROCEDURE TRAY) ×3 IMPLANT
SUPPORT SCROTAL LG STRP (MISCELLANEOUS) IMPLANT
SUPPORT SCROTAL MED ADLT STRP (MISCELLANEOUS) ×2 IMPLANT
SUPPORTER ATHLETIC LG (MISCELLANEOUS)
SUPPORTER ATHLETIC MED (MISCELLANEOUS) ×1
SUT CHROMIC 3 0 SH 27 (SUTURE) ×6 IMPLANT
SUT CHROMIC 4 0 SH 27 (SUTURE) IMPLANT
SUT MNCRL AB 4-0 PS2 18 (SUTURE) ×3 IMPLANT
SUT VIC AB 2-0 UR5 27 (SUTURE) IMPLANT
SUT VICRYL 0 TIES 12 18 (SUTURE) ×3 IMPLANT
SYR CONTROL 10ML LL (SYRINGE) IMPLANT
TOWEL OR 17X26 10 PK STRL BLUE (TOWEL DISPOSABLE) ×3 IMPLANT
WATER STERILE IRR 1500ML POUR (IV SOLUTION) IMPLANT
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
## 2017-04-10 NOTE — Transfer of Care (Signed)
Immediate Anesthesia Transfer of Care Note  Patient: Tim [name]  Procedure(s) Performed: RIGHT HYDROCELECTOMY ADULT (Right )  Patient Location: PACU  Anesthesia Type:General  Level of Consciousness: awake, drowsy and patient cooperative  Airway & Oxygen Therapy: Patient Spontanous Breathing and Patient connected to face mask oxygen  Post-op Assessment: Report given to RN and Post -op Vital signs reviewed and stable  Post vital signs: Reviewed and stable  Last Vitals:  Vitals:   04/10/17 0542  BP: 113/70  Pulse: 80  Resp: 16  Temp: 37 C  SpO2: 98%    Last Pain:  Vitals:   04/10/17 0542  TempSrc: Oral      Patients Stated Pain Goal: 4 (61/47/09 2957)  Complications: No apparent anesthesia complications

## 2017-04-10 NOTE — Anesthesia Postprocedure Evaluation (Signed)
Anesthesia Post Note  Patient: Tim [name]  Procedure(s) Performed: RIGHT HYDROCELECTOMY ADULT (Right )     Patient location during evaluation: PACU Anesthesia Type: General Level of consciousness: awake and alert Pain management: pain level controlled Vital Signs Assessment: post-procedure vital signs reviewed and stable Respiratory status: spontaneous breathing, nonlabored ventilation, respiratory function stable and patient connected to nasal cannula oxygen Cardiovascular status: blood pressure returned to baseline and stable Postop Assessment: no apparent nausea or vomiting Anesthetic complications: no    Last Vitals:  Vitals:   04/10/17 0945 04/10/17 1000  BP: 124/78 133/86  Pulse: 69 73  Resp: 14 12  Temp: 36.6 C 36.5 C  SpO2: 93% 94%    Last Pain:  Vitals:   04/10/17 0945  TempSrc:   PainSc: 4                  Phinley Schall S

## 2017-04-10 NOTE — Op Note (Signed)
Preoperative diagnosis: Right hydrocele   Postoperative diagnosis: Same   Procedure:Right hydrocele repair   Surgeon: Franchot Gallo, M.D.   Assistant: Stasia Cavalier, MD  Anesthesia: Gen.   Indications: Patient presented with scrotal swelling. A right hydrocele was confirmed with scrotal ultrasonography. The patient was symptomatic from his hydrocele and requested surgical intervention. He appeared to understand the risks benefits potential complications of this procedure.   Procedure: The patient was properly identified and marked in the holding room. He received preoperative IV antibiotics. He was then taken to the operating room where general anesthetic was administered using the LMA. The scrotum was then prepped and draped in the usual manner. Appropriate surgical timeout was performed. An incision was made in the median raphae of the scrotum. The right-sided hydrocele was encountered which was freed from the scrotal wall and then the testis and hydrocele sac were delivered from the incision. The hydrocele sac was then incised anteriorly and 600cc of clear fluid was obtained. The testis was carefully inspected and no other pathology was appreciated. The hydrocele sac was thin.  The sac was then plicated posteriorly with a running 3-0 chromic suture. The spermatic cord block was then performed with quarter percent Marcaine. The testis was returned to the hemiscrotum taking great care to make sure that there was no twisting of the spermatic cord. Inspection of the inside of the scrotum revealed no significant bleeding. A quarter-inch Penrose was brought through the lower aspect of the scrotum into the affected hemiscrotum and sutured to the skin. The scrotal incision was then closed anatomically, first with a running 3-0 chromic reapproximating the dartos fascia, and then a 4-0 Monocryl used to close the skin in a simple running fashion. dermabond was applied to the incision. A fluff  dressing and jockstrap was then placed Sponge and needle counts were correct. No obvious complications occurred and the patient was brought to recovery room in stable condition.      I have reviewed the above note and agree with all aspects of the dictated procedure. I participated in all aspects of the operation.

## 2017-04-10 NOTE — Anesthesia Preprocedure Evaluation (Addendum)
Anesthesia Evaluation  Patient identified by MRN, date of birth, ID band Patient awake    Reviewed: Allergy & Precautions, H&P , NPO status , Patient's Chart, lab work & pertinent test results  Airway Mallampati: II   Neck ROM: full    Dental  (+) Dental Advisory Given   Pulmonary neg pulmonary ROS,    breath sounds clear to auscultation       Cardiovascular negative cardio ROS   Rhythm:regular Rate:Normal     Neuro/Psych Anxiety Parkinson's dz. HOH    GI/Hepatic   Endo/Other    Renal/GU    BPH    Musculoskeletal   Abdominal   Peds  Hematology   Anesthesia Other Findings   Reproductive/Obstetrics                            Anesthesia Physical Anesthesia Plan  ASA: II  Anesthesia Plan: General   Post-op Pain Management:    Induction: Intravenous  PONV Risk Score and Plan: 2 and Ondansetron, Dexamethasone and Treatment may vary due to age or medical condition  Airway Management Planned: LMA  Additional Equipment:   Intra-op Plan:   Post-operative Plan:   Informed Consent: I have reviewed the patients History and Physical, chart, labs and discussed the procedure including the risks, benefits and alternatives for the proposed anesthesia with the patient or authorized representative who has indicated his/her understanding and acceptance.     Plan Discussed with: CRNA, Anesthesiologist and Surgeon  Anesthesia Plan Comments:         Anesthesia Quick Evaluation

## 2017-04-10 NOTE — Anesthesia Procedure Notes (Signed)
Procedure Name: LMA Insertion Date/Time: 04/10/2017 7:38 AM Performed by: Willeen Cass P Pre-anesthesia Checklist: Patient identified, Emergency Drugs available, Suction available and Patient being monitored Patient Re-evaluated:Patient Re-evaluated prior to induction Oxygen Delivery Method: Circle System Utilized Preoxygenation: Pre-oxygenation with 100% oxygen Induction Type: IV induction Ventilation: Mask ventilation without difficulty LMA: LMA inserted LMA Size: 4.0 Number of attempts: 1 Airway Equipment and Method: Bite block Placement Confirmation: positive ETCO2 Tube secured with: Tape Dental Injury: Teeth and Oropharynx as per pre-operative assessment

## 2017-04-10 NOTE — Discharge Instructions (Signed)
Scrotal surgery postoperative instructions  Wound:  In most cases your incision will have absorbable sutures that will dissolve within the first 2-3 weeks. Some will fall out even earlier. Expect some redness as the sutures dissolve but this should occur only around the sutures. If there is generalized redness, especially with increasing pain or swelling, let us know. The scrotum will very likely get "black and blue" as the blood in the tissues spread. Sometimes the whole scrotum will turn colors. The black and blue is followed by a yellow and brown color. In time, all the discoloration will go away. In some cases some firm swelling in the area of the testicle may persist for up to 4-6 weeks after the surgery and is considered normal in most cases.  Drain:  If the surgeon placed a drain through the bottom part of your scrotum, it is held in with a small stitch. When instructed (in 1 week), cut the small stitch and slide to drain out. Once the drain has been removed, a small hole made drain out for another day or 2. If so, keep a clean washcloth underneath your supportive undergarment, or sterile gauze. Until the hole seals up, all bathing should be in the shower, and not in the bathtub.  Diet:  You may return to your normal diet within 24 hours following your surgery. You may note some mild nausea and possibly vomiting the first 6-8 hours following surgery. This is usually due to the side effects of anesthesia, and will disappear quite soon. I would suggest clear liquids and a very light meal the first evening following your surgery.  Activity:  Your physical activity should be restricted the first 48 hours. During that time you should remain relatively inactive, moving about only when necessary. During the first 7-10 days following surgery you should avoid lifting any heavy objects (anything greater than 15 pounds), and avoid strenuous exercise. If you work, ask [place] specifically about your  restrictions, both for work and home. We will write a note to your employer if needed.  You should plan to wear a tight pair of jockey shorts or an athletic supporter for the first 4-5 days, even to sleep. This will keep the scrotum immobilized to some degree and keep the swelling down. You may find it more comfortable to wear a support longer.  Ice packs should be placed on and off over the scrotum for the first 48 hours. Frozen peas or corn in a ZipLock bag can be frozen, used and re-frozen. Fifteen minutes on and 15 minutes off is a reasonable schedule. The ice is a good pain reliever and keeps the swelling down.  Hygiene:  You may shower 48 hours after your surgery. Tub bathing should be restricted until the seventh day.   Medication:  You will be sent home with some type of pain medication. In many cases you will be sent home with a narcotic pain pill (Vicodin or Tylox). If the pain is not too bad, you may take either Tylenol (acetaminophen) or Advil (ibuprofen) which contain no narcotic agents, and might be tolerated a little better, with fewer side effects. If the pain medication you are sent home with does not control the pain, you will have to let us know. Some narcotic pain medications cannot be given or refilled by a phone call to a pharmacy.  Problems you should report to [place]:   Fever of 101.0 degrees Fahrenheit or greater.  Moderate or severe swelling under the skin incision  or involving the scrotum.  Drug reaction such as hives, a rash, nausea or vomiting.

## 2017-04-10 NOTE — Interval H&P Note (Signed)
History and Physical Interval Note:  04/10/2017 7:28 AM  Tim [name]  has presented today for surgery, with the diagnosis of RIGHT HYDROCELE  The various methods of treatment have been discussed with the patient and family. After consideration of risks, benefits and other options for treatment, the patient has consented to  Procedure(s): RIGHT HYDROCELECTOMY ADULT (Right) as a surgical intervention .  The patient's history has been reviewed, patient examined, no change in status, stable for surgery.  I have reviewed the patient's chart and labs.  Questions were answered to the patient's satisfaction.     Jorja Loa

## 2017-04-23 IMAGING — CT CT RENAL STONE PROTOCOL
2 of 3 series · 14 of 46 positions shown, 16 images · non-contrast
Comparison: CT the abdomen and pelvis 12/17/2010.

CLINICAL DATA: 70-year-old male with history of decreased urinary
output over the past 3 days.

EXAM:
CT ABDOMEN AND PELVIS WITHOUT CONTRAST
TECHNIQUE: Multidetector CT imaging of the abdomen and pelvis was performed
following the standard protocol without IV contrast.

[Series 4: lung · axial · 0.71mm/px · z∈[-162,-52]mm · 11 of 65 slices shown, 13 images]
[im 5/65  soft-tissue]
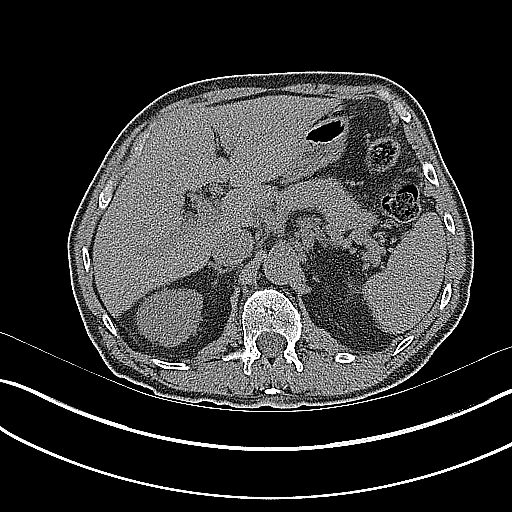
[im 5/65  bone]
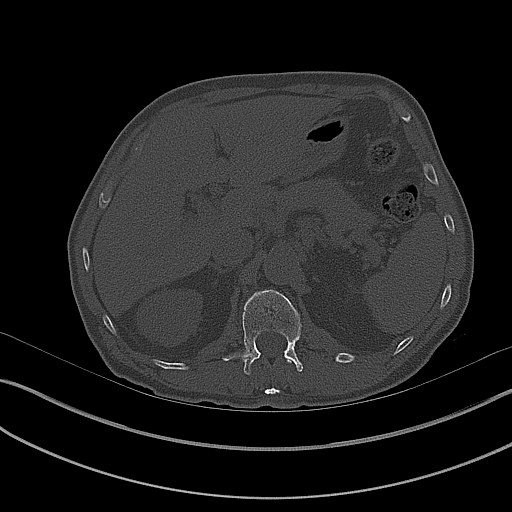
[im 11/65  soft-tissue]
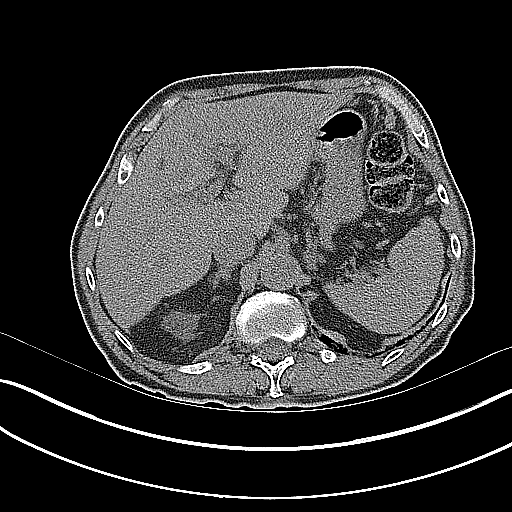
[im 15/65  soft-tissue]
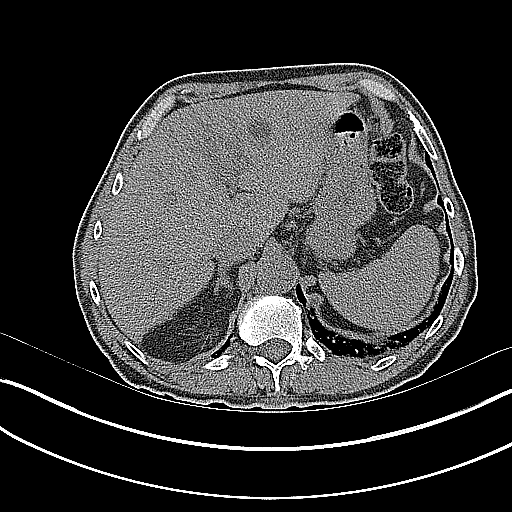
[im 21/65  soft-tissue]
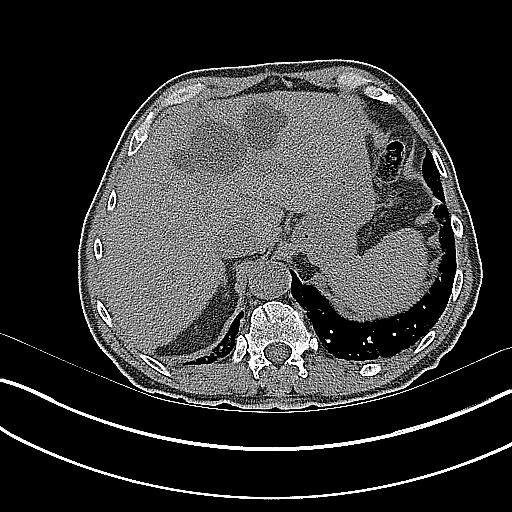
[im 27/65  soft-tissue]
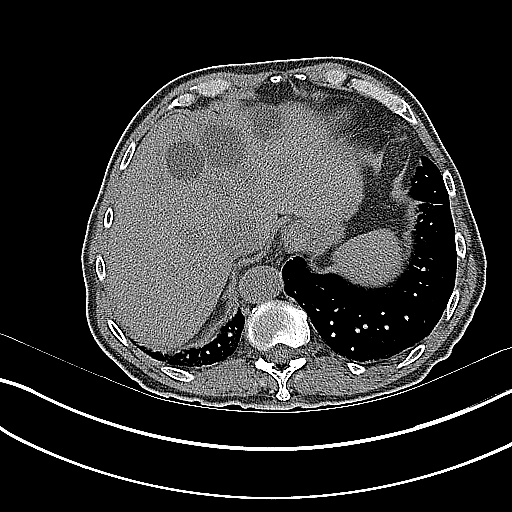
[im 34/65  soft-tissue]
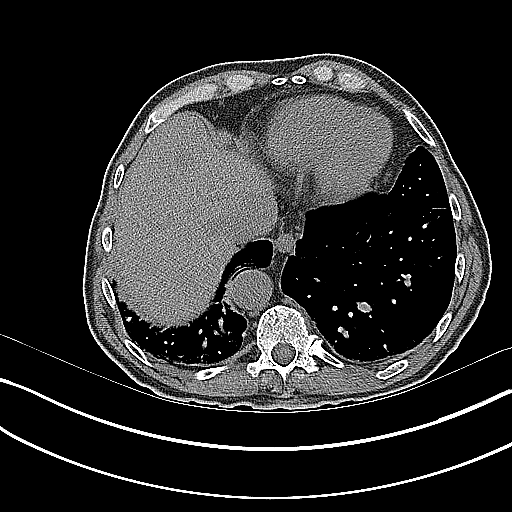
[im 38/65  soft-tissue]
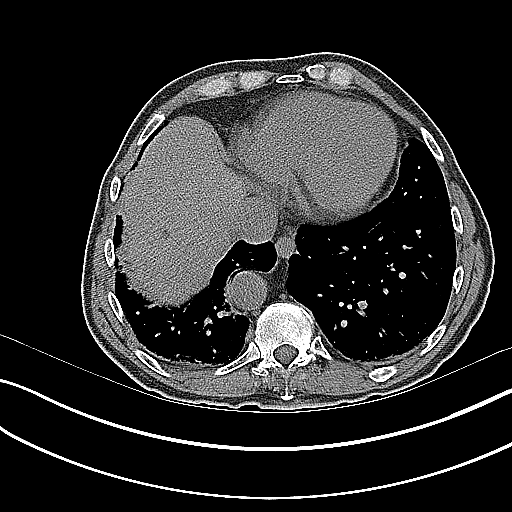
[im 44/65  soft-tissue]
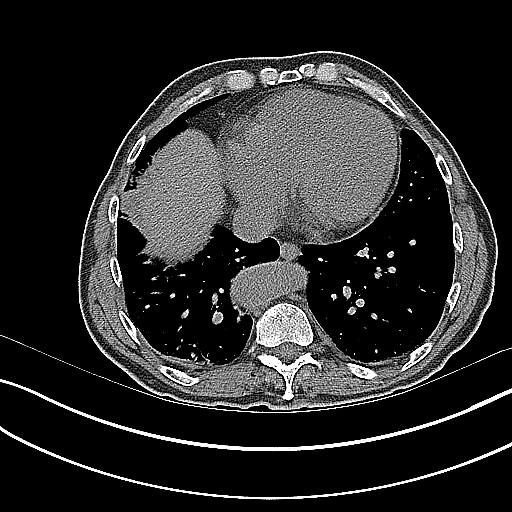
[im 50/65  soft-tissue]
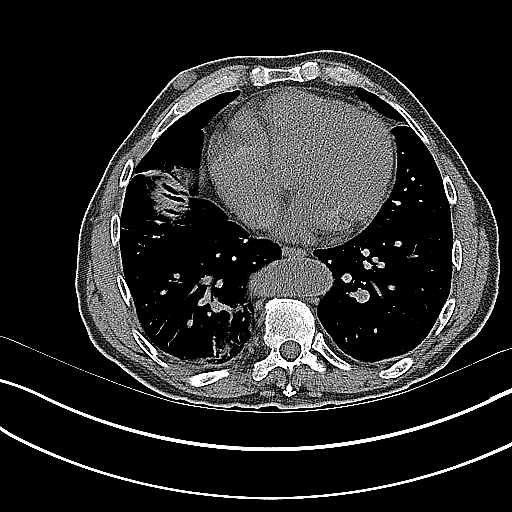
[im 50/65  bone]
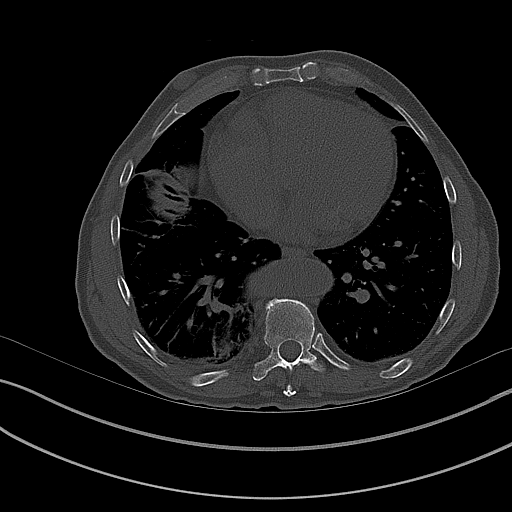
[im 54/65  soft-tissue]
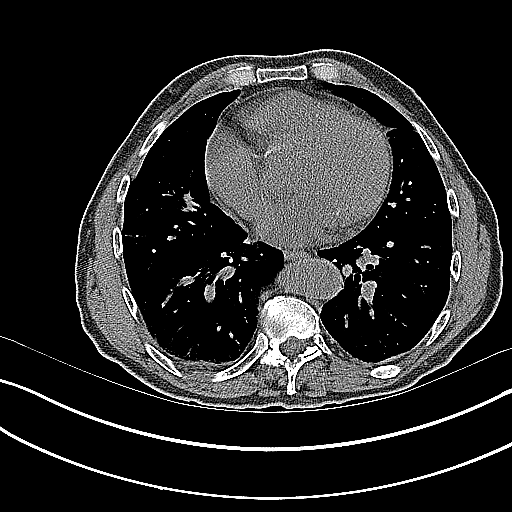
[im 60/65  soft-tissue]
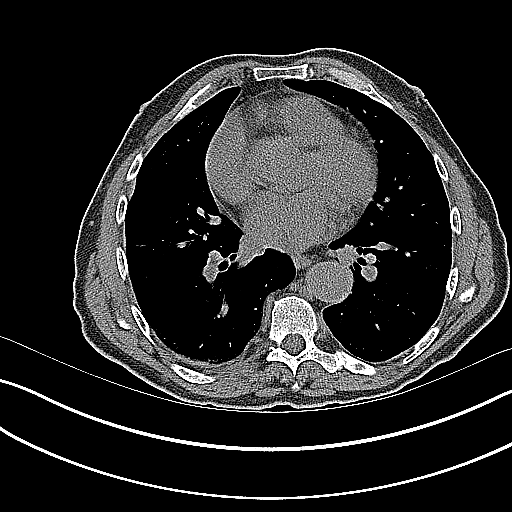

[Series 5: coronal · coronal · 0.70mm/px · 3 of 112 slices shown]
[im 38/112  soft-tissue]
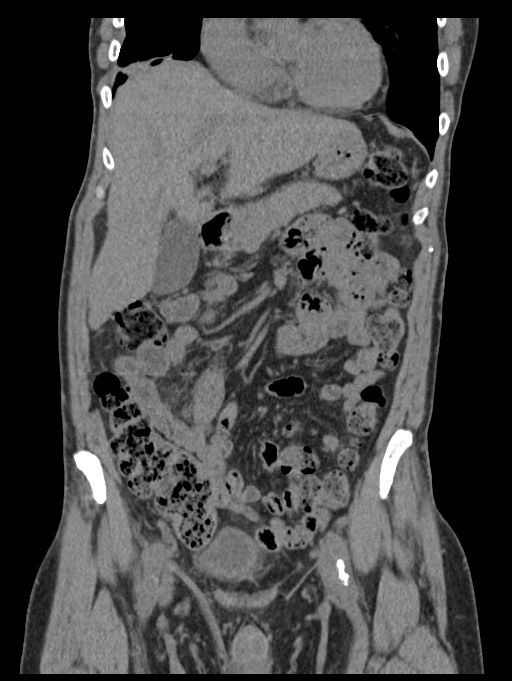
[im 50/112  soft-tissue]
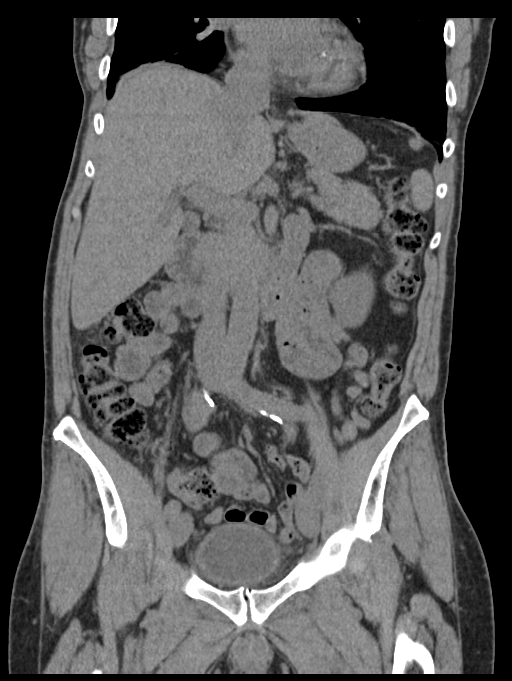
[im 62/112  soft-tissue]
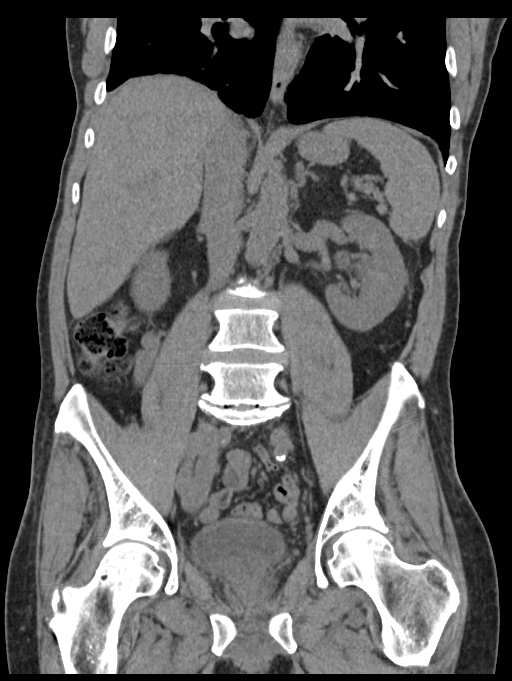

[14 of 46 positions shown; findings below may reference images not displayed]

FINDINGS: Lower chest: Atherosclerotic calcifications in the left circumflex
coronary artery. Tortuosity of the distal descending thoracic aorta.
Mild pleural thickening in the posterior aspect of the lower right
hemithorax. Linear scarring in the lung bases, most evident in the
right middle and lower lobes.

Hepatobiliary: There are numerous low-attenuation lesions noted
throughout the liver, incompletely characterized on today's
noncontrast CT examination. Most of these are similar in size,
number and distribution compared to the prior examination,
presumably cysts. There is one lesion in segment 4A of the liver
which has significantly increased in size compared to the prior
study, currently measuring 4.4 cm (image 18 of series 2), which is
considered indeterminate. Unenhanced appearance of the gallbladder
is normal.

Pancreas: No definite pancreatic mass or peripancreatic inflammatory
changes are noted on today's noncontrast CT examination.

Spleen: Calcified granuloma in the spleen.

Adrenals/Urinary Tract: There are no abnormal calcifications within
the collecting system of either kidney, along the course of either
ureter, or within the lumen of the urinary bladder. No
hydroureteronephrosis or perinephric stranding to suggest urinary
tract obstruction at this time. The unenhanced appearance of the
kidneys is unremarkable bilaterally. Unenhanced appearance of the
urinary bladder is unremarkable. Bilateral adrenal glands are normal
in appearance.

Stomach/Bowel: Unenhanced appearance of the stomach is normal. There
is no pathologic dilatation of small bowel or colon. The appendix is
not confidently identified and may be surgically absent. Regardless,
there are no inflammatory changes noted adjacent to the cecum to
suggest the presence of an acute appendicitis at this time.

Vascular/Lymphatic: Aortic atherosclerosis, without definite
aneurysm in the abdominal or pelvic vasculature. No lymphadenopathy
noted in the abdomen or pelvis.

Reproductive: Prostate gland is enlarged and heterogeneous in
appearance with mild median lobe hypertrophy measuring up to 4.8 x
5.8 x 6.1 cm. Seminal vesicles are unremarkable in appearance.

Other: No significant volume of ascites.  No pneumoperitoneum.

Musculoskeletal: There are no aggressive appearing lytic or blastic
lesions noted in the visualized portions of the skeleton.
IMPRESSION: 1. No acute findings in the abdomen or pelvis. Specifically, no
urinary tract calculi no findings to suggest urinary tract
obstruction are noted at this time.
2. **An incidental finding of potential clinical significance has
been found. There are multiple low-attenuation liver lesions. While
most of these are very similar to remote prior study 12/17/2010, and
presumably represent simple cysts, there is one lesion which has
grown considerably compared to the prior study, currently measuring
4.4 cm in segment 4A (previously only 2.7 cm on 12/17/2010) which is
considered indeterminate. While this may simply represent a benign
cyst, further characterization with nonemergent MRI of the abdomen
with and without IV gadolinium is recommended in the near future to
provide definitive characterization. **
3. Prostatomegaly with median lobe hypertrophy.
4. Aortic atherosclerosis, in addition to left circumflex coronary
artery disease. Assessment for potential risk factor modification,
dietary therapy or pharmacologic therapy may be warranted, if
clinically indicated.
5. Additional incidental findings, as above.

## 2017-04-24 ENCOUNTER — Encounter (HOSPITAL_COMMUNITY): Payer: Self-pay | Admitting: *Deleted

## 2017-04-24 ENCOUNTER — Ambulatory Visit (HOSPITAL_COMMUNITY): Payer: Medicare Other | Admitting: Anesthesiology

## 2017-04-24 ENCOUNTER — Encounter (HOSPITAL_COMMUNITY)
Admission: RE | Disposition: A | Discharge status: Home / Self Care | Payer: Self-pay | Source: Ambulatory Visit | Attending: Urology

## 2017-04-24 ENCOUNTER — Other Ambulatory Visit: Payer: Self-pay | Admitting: Urology

## 2017-04-24 ENCOUNTER — Observation Stay (HOSPITAL_COMMUNITY)
Admission: RE | Admit: 2017-04-24 | Discharge: 2017-04-25 | Disposition: A | Discharge status: Home / Self Care | Payer: Medicare Other | Source: Ambulatory Visit | Attending: Urology | Admitting: Urology

## 2017-04-24 DIAGNOSIS — Z79899 Other long term (current) drug therapy: Secondary | ICD-10-CM | POA: Insufficient documentation

## 2017-04-24 DIAGNOSIS — N3000 Acute cystitis without hematuria: Secondary | ICD-10-CM | POA: Diagnosis not present

## 2017-04-24 DIAGNOSIS — N400 Enlarged prostate without lower urinary tract symptoms: Secondary | ICD-10-CM | POA: Diagnosis not present

## 2017-04-24 DIAGNOSIS — N454 Abscess of epididymis or testis: Principal | ICD-10-CM | POA: Insufficient documentation

## 2017-04-24 DIAGNOSIS — L409 Psoriasis, unspecified: Secondary | ICD-10-CM | POA: Diagnosis not present

## 2017-04-24 DIAGNOSIS — N430 Encysted hydrocele: Secondary | ICD-10-CM | POA: Diagnosis not present

## 2017-04-24 DIAGNOSIS — E7800 Pure hypercholesterolemia, unspecified: Secondary | ICD-10-CM | POA: Insufficient documentation

## 2017-04-24 DIAGNOSIS — N492 Inflammatory disorders of scrotum: Secondary | ICD-10-CM | POA: Diagnosis present

## 2017-04-24 DIAGNOSIS — F419 Anxiety disorder, unspecified: Secondary | ICD-10-CM | POA: Diagnosis not present

## 2017-04-24 DIAGNOSIS — G20 Parkinson's disease: Secondary | ICD-10-CM | POA: Insufficient documentation

## 2017-04-24 DIAGNOSIS — N431 Infected hydrocele: Secondary | ICD-10-CM | POA: Diagnosis not present

## 2017-04-24 DIAGNOSIS — N99820 Postprocedural hemorrhage and hematoma of a genitourinary system organ or structure following a genitourinary system procedure: Secondary | ICD-10-CM | POA: Diagnosis not present

## 2017-04-24 HISTORY — PX: IRRIGATION AND DEBRIDEMENT ABSCESS: SHX5252

## 2017-04-24 LAB — BASIC METABOLIC PANEL
ANION GAP: 13 (ref 5–15)
BUN: 19 mg/dL (ref 6–20)
CO2: 22 mmol/L (ref 22–32)
Calcium: 9.3 mg/dL (ref 8.9–10.3)
Chloride: 100 mmol/L — ABNORMAL LOW (ref 101–111)
Creatinine, Ser: 0.96 mg/dL (ref 0.61–1.24)
GFR calc Af Amer: >60 mL/min (ref >60)
GFR calc non Af Amer: >60 mL/min (ref >60)
GLUCOSE: 97 mg/dL (ref 65–99)
POTASSIUM: 4.1 mmol/L (ref 3.5–5.1)
Sodium: 135 mmol/L (ref 135–145)

## 2017-04-24 SURGERY — IRRIGATION AND DEBRIDEMENT ABSCESS
Anesthesia: General | Site: Scrotum

## 2017-04-24 MED ORDER — HYDROMORPHONE HCL 1 MG/ML IJ SOLN
0.5000 mg | INTRAMUSCULAR | Status: DC | PRN
  Administered 2017-04-24: 0.5 mg via INTRAVENOUS
  Filled 2017-04-24: qty 1

## 2017-04-24 MED ORDER — BUPIVACAINE-EPINEPHRINE (PF) 0.5% -1:200000 IJ SOLN
INTRAMUSCULAR | Status: AC
  Filled 2017-04-24: qty 30

## 2017-04-24 MED ORDER — BUPIVACAINE HCL 0.25 % IJ SOLN
INTRAMUSCULAR | Status: AC
  Filled 2017-04-24: qty 1

## 2017-04-24 MED ORDER — BUSPIRONE HCL 5 MG PO TABS
7.5000 mg | ORAL_TABLET | Freq: Every day | ORAL | Status: DC
  Administered 2017-04-24: 7.5 mg via ORAL
  Filled 2017-04-24: qty 2

## 2017-04-24 MED ORDER — PHENYLEPHRINE 40 MCG/ML (10ML) SYRINGE FOR IV PUSH (FOR BLOOD PRESSURE SUPPORT)
PREFILLED_SYRINGE | INTRAVENOUS | Status: DC | PRN
  Administered 2017-04-24: 80 ug via INTRAVENOUS

## 2017-04-24 MED ORDER — FENTANYL CITRATE (PF) 100 MCG/2ML IJ SOLN
25.0000 ug | INTRAMUSCULAR | Status: DC | PRN
  Administered 2017-04-24: 50 ug via INTRAVENOUS

## 2017-04-24 MED ORDER — DIPHENHYDRAMINE HCL 12.5 MG/5ML PO ELIX: 12.5000 mg | ORAL_SOLUTION | Freq: Four times a day (QID) | ORAL | Status: DC | PRN

## 2017-04-24 MED ORDER — FENTANYL CITRATE (PF) 100 MCG/2ML IJ SOLN
INTRAMUSCULAR | Status: AC
  Filled 2017-04-24: qty 2

## 2017-04-24 MED ORDER — BUPIVACAINE-EPINEPHRINE 0.5% -1:200000 IJ SOLN
INTRAMUSCULAR | Status: DC | PRN
  Administered 2017-04-24: 10 mL

## 2017-04-24 MED ORDER — LACTATED RINGERS IV SOLN
INTRAVENOUS | Status: DC
  Administered 2017-04-24: 15:00:00 via INTRAVENOUS

## 2017-04-24 MED ORDER — ONDANSETRON 4 MG PO TBDP: 4.0000 mg | ORAL_TABLET | Freq: Four times a day (QID) | ORAL | Status: DC | PRN

## 2017-04-24 MED ORDER — HYDROCODONE-ACETAMINOPHEN 5-325 MG PO TABS
1.0000 | ORAL_TABLET | ORAL | Status: DC | PRN
  Administered 2017-04-25 (×2): 1 via ORAL
  Filled 2017-04-24 (×2): qty 1

## 2017-04-24 MED ORDER — SODIUM CHLORIDE 0.9 % IV SOLN
INTRAVENOUS | Status: DC
  Administered 2017-04-24: 21:00:00 via INTRAVENOUS

## 2017-04-24 MED ORDER — ONDANSETRON HCL 4 MG/2ML IJ SOLN
INTRAMUSCULAR | Status: AC
  Filled 2017-04-24: qty 2

## 2017-04-24 MED ORDER — CARBIDOPA-LEVODOPA 25-100 MG PO TABS
1.0000 | ORAL_TABLET | Freq: Every day | ORAL | Status: DC
  Administered 2017-04-24 – 2017-04-25 (×3): 1 via ORAL
  Filled 2017-04-24 (×3): qty 1

## 2017-04-24 MED ORDER — DIPHENHYDRAMINE HCL 50 MG/ML IJ SOLN: 12.5000 mg | Freq: Four times a day (QID) | INTRAMUSCULAR | Status: DC | PRN

## 2017-04-24 MED ORDER — PROPOFOL 10 MG/ML IV BOLUS
INTRAVENOUS | Status: AC
  Filled 2017-04-24: qty 20

## 2017-04-24 MED ORDER — MELATONIN 3 MG PO TABS
3.0000 mg | ORAL_TABLET | Freq: Every day | ORAL | Status: DC
Start: 2017-04-24 — End: 2017-04-24

## 2017-04-24 MED ORDER — ONDANSETRON HCL 4 MG/2ML IJ SOLN
INTRAMUSCULAR | Status: DC | PRN
  Administered 2017-04-24: 4 mg via INTRAVENOUS

## 2017-04-24 MED ORDER — OXYCODONE HCL 5 MG PO TABS: 5.0000 mg | ORAL_TABLET | Freq: Once | ORAL | Status: DC | PRN

## 2017-04-24 MED ORDER — DOCUSATE SODIUM 100 MG PO CAPS
200.0000 mg | ORAL_CAPSULE | Freq: Two times a day (BID) | ORAL | Status: DC
  Administered 2017-04-24 – 2017-04-25 (×2): 200 mg via ORAL
  Filled 2017-04-24 (×2): qty 2

## 2017-04-24 MED ORDER — ONDANSETRON HCL 4 MG/2ML IJ SOLN
4.0000 mg | Freq: Four times a day (QID) | INTRAMUSCULAR | Status: DC | PRN
Start: 2017-04-24 — End: 2017-04-25

## 2017-04-24 MED ORDER — DEXAMETHASONE SODIUM PHOSPHATE 10 MG/ML IJ SOLN
INTRAMUSCULAR | Status: AC
  Filled 2017-04-24: qty 1

## 2017-04-24 MED ORDER — ACETAMINOPHEN 325 MG PO TABS: 325.0000 mg | ORAL_TABLET | ORAL | Status: DC | PRN

## 2017-04-24 MED ORDER — FENTANYL CITRATE (PF) 100 MCG/2ML IJ SOLN
INTRAMUSCULAR | Status: DC | PRN
Start: 2017-04-24 — End: 2017-04-24
  Administered 2017-04-24 (×4): 25 ug via INTRAVENOUS

## 2017-04-24 MED ORDER — ACETAMINOPHEN 160 MG/5ML PO SOLN: 325.0000 mg | ORAL | Status: DC | PRN

## 2017-04-24 MED ORDER — MIDAZOLAM HCL 2 MG/2ML IJ SOLN
INTRAMUSCULAR | Status: AC
  Filled 2017-04-24: qty 2

## 2017-04-24 MED ORDER — OXYCODONE HCL 5 MG/5ML PO SOLN
5.0000 mg | Freq: Once | ORAL | Status: DC | PRN
  Filled 2017-04-24: qty 5

## 2017-04-24 MED ORDER — FENTANYL CITRATE (PF) 100 MCG/2ML IJ SOLN
INTRAMUSCULAR | Status: AC
  Administered 2017-04-24: 50 ug via INTRAVENOUS
  Filled 2017-04-24: qty 2

## 2017-04-24 MED ORDER — CEFAZOLIN SODIUM-DEXTROSE 2-4 GM/100ML-% IV SOLN
2.0000 g | Freq: Once | INTRAVENOUS | Status: AC
  Administered 2017-04-24: 2 g via INTRAVENOUS
  Filled 2017-04-24: qty 100

## 2017-04-24 MED ORDER — CEFAZOLIN SODIUM-DEXTROSE 2-4 GM/100ML-% IV SOLN
2.0000 g | Freq: Three times a day (TID) | INTRAVENOUS | Status: AC
  Administered 2017-04-25: 2 g via INTRAVENOUS
  Filled 2017-04-24: qty 100

## 2017-04-24 SURGICAL SUPPLY — 38 items
BENZOIN TINCTURE PRP APPL 2/3 (GAUZE/BANDAGES/DRESSINGS) IMPLANT
BNDG GAUZE ELAST 4 BULKY (GAUZE/BANDAGES/DRESSINGS) ×3 IMPLANT
CLOSURE WOUND 1/2 X4 (GAUZE/BANDAGES/DRESSINGS)
COVER SURGICAL LIGHT HANDLE (MISCELLANEOUS) ×3 IMPLANT
DERMABOND ADVANCED (GAUZE/BANDAGES/DRESSINGS)
DERMABOND ADVANCED .7 DNX12 (GAUZE/BANDAGES/DRESSINGS) IMPLANT
DISSECTOR ROUND CHERRY 3/8 STR (MISCELLANEOUS) IMPLANT
DRAIN PENROSE 18X1/4 LTX STRL (WOUND CARE) ×3 IMPLANT
DRAPE LAPAROTOMY T 98X78 PEDS (DRAPES) ×3 IMPLANT
ELECT REM PT RETURN 15FT ADLT (MISCELLANEOUS) ×3 IMPLANT
GAUZE SPONGE 4X4 12PLY STRL (GAUZE/BANDAGES/DRESSINGS) IMPLANT
GAUZE SPONGE 4X4 16PLY XRAY LF (GAUZE/BANDAGES/DRESSINGS) IMPLANT
GLOVE BIOGEL M 8.0 STRL (GLOVE) ×3 IMPLANT
GOWN STRL REUS W/TWL XL LVL3 (GOWN DISPOSABLE) ×3 IMPLANT
KIT BASIN OR (CUSTOM PROCEDURE TRAY) ×3 IMPLANT
NEEDLE HYPO 22GX1.5 SAFETY (NEEDLE) ×3 IMPLANT
NS IRRIG 1000ML POUR BTL (IV SOLUTION) ×3 IMPLANT
PACK GENERAL/GYN (CUSTOM PROCEDURE TRAY) ×3 IMPLANT
STRIP CLOSURE SKIN 1/2X4 (GAUZE/BANDAGES/DRESSINGS) IMPLANT
SUPPORT SCROTAL LG STRP (MISCELLANEOUS) ×2 IMPLANT
SUPPORTER ATHLETIC LG (MISCELLANEOUS) ×1
SUT CHROMIC 2 0 SH (SUTURE) IMPLANT
SUT CHROMIC 3 0 SH 27 (SUTURE) ×3 IMPLANT
SUT ETHILON 3 0 PS 1 (SUTURE) ×9 IMPLANT
SUT MNCRL AB 4-0 PS2 18 (SUTURE) IMPLANT
SUT SILK 0 (SUTURE)
SUT SILK 0 30XBRD TIE 6 (SUTURE) IMPLANT
SUT SILK 2 0 (SUTURE)
SUT SILK 2-0 18XBRD TIE 12 (SUTURE) IMPLANT
SUT SILK 3 0 SH 30 (SUTURE) IMPLANT
SUT VIC AB 2-0 SH 27 (SUTURE)
SUT VIC AB 2-0 SH 27X BRD (SUTURE) IMPLANT
SUT VIC AB 3-0 SH 27 (SUTURE)
SUT VIC AB 3-0 SH 27XBRD (SUTURE) IMPLANT
SWAB COLLECTION DEVICE MRSA (MISCELLANEOUS) ×3 IMPLANT
SWAB CULTURE ESWAB REG 1ML (MISCELLANEOUS) ×3 IMPLANT
SYR CONTROL 10ML LL (SYRINGE) ×3 IMPLANT
TOWEL OR 17X26 10 PK STRL BLUE (TOWEL DISPOSABLE) ×3 IMPLANT

## 2017-04-24 NOTE — Anesthesia Procedure Notes (Signed)
Procedure Name: LMA Insertion Date/Time: 04/24/2017 5:06 PM Performed by: Anne Fu Pre-anesthesia Checklist: Patient identified, Emergency Drugs available, Suction available, Patient being monitored and Timeout performed Patient Re-evaluated:Patient Re-evaluated prior to induction Oxygen Delivery Method: Circle system utilized Preoxygenation: Pre-oxygenation with 100% oxygen Induction Type: IV induction Ventilation: Mask ventilation without difficulty LMA: LMA inserted LMA Size: 4.0 Number of attempts: 1 Placement Confirmation: positive ETCO2 and breath sounds checked- equal and bilateral Tube secured with: Tape

## 2017-04-24 NOTE — Progress Notes (Signed)
PHARMACIST - PHYSICIAN ORDER COMMUNICATION  CONCERNING: P&T Medication Policy on Herbal Medications  DESCRIPTION:  This patient's order for:  melatonin  has been noted.  This product(s) is classified as an "herbal" or natural product. Due to a lack of definitive safety studies or FDA approval, nonstandard manufacturing practices, plus the potential risk of unknown drug-drug interactions while on inpatient medications, the Pharmacy and Therapeutics Committee does not permit the use of "herbal" or natural products of this type within [facility].   ACTION TAKEN: The pharmacy department is unable to verify this order at this time and your patient has been informed of this safety policy. Please reevaluate patient's clinical condition at discharge and address if the herbal or natural product(s) should be resumed at that time.  Hershal Coria, PharmD Pager: [phone] 04/24/2017

## 2017-04-24 NOTE — Transfer of Care (Signed)
Immediate Anesthesia Transfer of Care Note  Patient: Tim [name]  Procedure(s) Performed: Procedure(s): IRRIGATION AND DEBRIDEMENT SCROTAL ABSCESS (N/A)  Patient Location: PACU  Anesthesia Type:General  Level of Consciousness:  sedated, patient cooperative and responds to stimulation  Airway & Oxygen Therapy:Patient Spontanous Breathing and Patient connected to face mask oxgen  Post-op Assessment:  Report given to PACU RN and Post -op Vital signs reviewed and stable  Post vital signs:  Reviewed and stable  Last Vitals:  Vitals:   04/24/17 1815  BP: (P) 124/78  Pulse: (P) 78  Resp: (P) 18  Temp: (P) 37.1 C  SpO2: (P) 31%    Complications: No apparent anesthesia complications

## 2017-04-24 NOTE — Anesthesia Preprocedure Evaluation (Signed)
Anesthesia Evaluation  Patient identified by MRN, date of birth, ID band Patient awake    Reviewed: Allergy & Precautions, H&P , NPO status , Patient's Chart, lab work & pertinent test results  History of Anesthesia Complications Negative for: history of anesthetic complications  Airway Mallampati: II  TM Distance: >3 FB Neck ROM: full    Dental  (+) Dental Advisory Given, Caps   Pulmonary neg pulmonary ROS,    breath sounds clear to auscultation       Cardiovascular negative cardio ROS   Rhythm:regular Rate:Normal     Neuro/Psych Anxiety Parkinson's dz. HOH    GI/Hepatic   Endo/Other    Renal/GU    BPH    Musculoskeletal   Abdominal   Peds  Hematology   Anesthesia Other Findings   Reproductive/Obstetrics                             Anesthesia Physical Anesthesia Plan  ASA: III  Anesthesia Plan: General   Post-op Pain Management:    Induction: Intravenous  PONV Risk Score and Plan: 2 and Ondansetron and Dexamethasone  Airway Management Planned: LMA  Additional Equipment: None  Intra-op Plan:   Post-operative Plan: Extubation in OR  Informed Consent: I have reviewed the patients History and Physical, chart, labs and discussed the procedure including the risks, benefits and alternatives for the proposed anesthesia with the patient or authorized representative who has indicated his/her understanding and acceptance.   Dental advisory given  Plan Discussed with: CRNA and Surgeon  Anesthesia Plan Comments:         Anesthesia Quick Evaluation

## 2017-04-24 NOTE — Op Note (Signed)
Operative Note  Preoperative diagnosis:  1.  Right peri-testicular abscess  Postoperative diagnosis: 1.  Right peri-testicular abscess  Procedure(s): 1.  Incision and drainage of 5 x 10 x 5 cm right peri-testicular abscess  Surgeon: Ellison Hughs, MD  Assistants:  None  Anesthesia:  Gen  Complications:  None  EBL:  50 mL  Specimens: 1. Fluid swab for culture and sensitivity   Drains/Catheters: 1.  Pen-rose drain exiting the dependent scrotum  Intraoperative findings:  5 x 10 x 5 cm right peri-testicular abscess.  Viable right testicle  Indication:  Tim [name] is a 71 y.o. male with a painful and enlarged right testicle.  He is status post right hydrocelectomy on 04/10/2017 with Dr. Dorina Hoyer.  The patient was examined in the office today and was found to have a large volume of purulent fluid surrounding his right testicle after transcrotal aspiration.  He has been consented for the above procedures, voices understanding and wishes to proceed.  Description of procedure:  After informed consent was obtained, the patient was brought to the operating room and general endotracheal anesthesia was administered.  The patient was placed in the supine position and prepped and draped in the usual sterile fashion.  A timeout was performed.  A 5 cm incision was made along his previous scar.  The surrounding cremasteric and dartos fascia was then incised using electrocautery.  On entering the tunica vaginalis, approximately 50 mL of grossly purulent fluid was expressed from the right hemiscrotum.  This fluid was swabbed for culture and sensitivity.  There is an extensive amount of loculations surrounding the right testicle.  The abscess cavity measured proximally 5 x 10 x 5 cm.  The right testicle was then eventually expressed through the scrotal incision and there was a necrotic tissue surrounding the right testicle, likely the tunica vaginalis.  Using a combination of blunt dissection and  electrocautery, all necrotic tissue surrounding the right testicle and within the right hemiscrotum was removed.  The tunica vaginalis surrounding the right testicle was incised.  The testicle was completely inspected and did not appear torsed or nonviable.  The right hemiscrotum was then inspected for any residual bleeding following the blunt dissection.  The scrotal cavity was the extensively irrigated with normal saline.  All bleeding was addressed with electrocautery and the scrotal wound and right hemiscrotal cavity appeared hemostatic at the conclusion of the case.  The tunica vaginalis overlying the testicle was then reapproximated with interrupted 3-0 chromic suture.  The right testicle was then placed back in the right hemiscrotum, assuring no spermatic cord torsion.  1 cm incision was then made in the dependent portion of his scrotum and a Penrose drain was passed through this incision and into the cavity of the right hemiscrotum.  The scrotal skin was then loosely reapproximated with interrupted 3-0 nylon sutures in an interrupted fashion.  The patient is genitals were then dressed with cotton fluffs and a jockstrap.  He tolerated the procedure well and was transferred to the postanesthesia in stable condition.    Plan:  Will monitor the patient on the floor post-op.  Continue antibiotic coverage with Ancef.
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
## 2017-04-24 NOTE — Interval H&P Note (Signed)
History and Physical Interval Note:  04/24/2017 4:54 PM  Tim [name]  has presented today for surgery, with the diagnosis of infectected hydrocele  The various methods of treatment have been discussed with the patient and family. After consideration of risks, benefits and other options for treatment, the patient has consented to  Procedure(s): IRRIGATION AND DEBRIDEMENT SCROTAL ABSCESS (N/A) as a surgical intervention .  The patient's history has been reviewed, patient examined, no change in status, stable for surgery.  I have reviewed the patient's chart and labs.  Questions were answered to the patient's satisfaction.     [name]

## 2017-04-24 NOTE — Brief Op Note (Signed)
04/24/2017  6:17 PM  PATIENT:  Tim [name]  72 y.o. male  PRE-OPERATIVE DIAGNOSIS:  infectected hydrocele  POST-OPERATIVE DIAGNOSIS:  infectected hydrocele  PROCEDURE:  Procedure(s): IRRIGATION AND DEBRIDEMENT SCROTAL ABSCESS (N/A)  SURGEON:  Surgeon(s) and Role:    * Takirah Binford, Conception Oms, MD - Primary  PHYSICIAN ASSISTANT: None  ASSISTANTS: None  ANESTHESIA:  General  EBL:  25 mL   BLOOD ADMINISTERED: None  DRAINS: Scrotal pen-rose drain  LOCAL MEDICATIONS USED:  Marcaine (0.5%)   SPECIMEN: Abscess fluid  DISPOSITION OF SPECIMEN: Sent for culture and sensitivity   COUNTS:  Correct   DICTATION: Dragon  PLAN OF CARE: Observation   PATIENT DISPOSITION: Monitor on the floor.   Delay start of Pharmacological VTE agent (>24hrs) due to surgical blood loss or risk of bleeding: yes

## 2017-04-25 ENCOUNTER — Encounter (HOSPITAL_COMMUNITY): Payer: Self-pay | Admitting: *Deleted

## 2017-04-25 DIAGNOSIS — N400 Enlarged prostate without lower urinary tract symptoms: Secondary | ICD-10-CM | POA: Diagnosis not present

## 2017-04-25 DIAGNOSIS — F419 Anxiety disorder, unspecified: Secondary | ICD-10-CM | POA: Diagnosis not present

## 2017-04-25 DIAGNOSIS — E7800 Pure hypercholesterolemia, unspecified: Secondary | ICD-10-CM | POA: Diagnosis not present

## 2017-04-25 DIAGNOSIS — N454 Abscess of epididymis or testis: Secondary | ICD-10-CM | POA: Diagnosis not present

## 2017-04-25 DIAGNOSIS — L409 Psoriasis, unspecified: Secondary | ICD-10-CM | POA: Diagnosis not present

## 2017-04-25 DIAGNOSIS — G20 Parkinson's disease: Secondary | ICD-10-CM | POA: Diagnosis not present

## 2017-04-25 LAB — BASIC METABOLIC PANEL
ANION GAP: 9 (ref 5–15)
BUN: 11 mg/dL (ref 6–20)
CHLORIDE: 100 mmol/L — AB (ref 101–111)
CO2: 27 mmol/L (ref 22–32)
Calcium: 8.9 mg/dL (ref 8.9–10.3)
Creatinine, Ser: 0.84 mg/dL (ref 0.61–1.24)
GFR calc non Af Amer: >60 mL/min (ref >60)
Glucose, Bld: 113 mg/dL — ABNORMAL HIGH (ref 65–99)
POTASSIUM: 3.8 mmol/L (ref 3.5–5.1)
SODIUM: 136 mmol/L (ref 135–145)

## 2017-04-25 MED ORDER — HYDROCODONE-ACETAMINOPHEN 5-325 MG PO TABS: 1.0000 | ORAL_TABLET | ORAL | 0 refills | Status: DC | PRN

## 2017-04-25 MED ORDER — CEPHALEXIN 500 MG PO CAPS: 500.0000 mg | ORAL_CAPSULE | Freq: Three times a day (TID) | ORAL | 0 refills | Status: DC

## 2017-04-25 NOTE — Discharge Instructions (Signed)
1. Keep dry dressing/gauze under supporter  2. OK to shower but no tub baths  3. Complete antibiotics  4. OK for ice bag over supporter  5. We will call you to set up followup

## 2017-04-25 NOTE — Progress Notes (Signed)
Pt was discharged home today. Instructions were reviewed with patient, and questions were answered. Pt was taken to main entrance via wheelchair by NT.  

## 2017-04-25 NOTE — Discharge Summary (Signed)
Patient ID: Tim [name] MRN: [id number] DOB/AGE: [birth year] 71 y.o.  Admit date: 04/24/2017 Discharge date: 04/25/2017  Primary Care Physician:  Lawerance Cruel, MD  Discharge Diagnoses: Scrotal abscess  Present on Admission: . Scrotal abscess   Consults:  None   Discharge Medications: Allergies as of 04/25/2017      Reactions   Demerol [meperidine] Hives      Medication List    STOP taking these medications   oxycodone 5 MG capsule Commonly known as:  OXY-IR     TAKE these medications   busPIRone 15 MG tablet Commonly known as:  BUSPAR Take 7.5 mg by mouth at bedtime.   carbidopa-levodopa 25-100 MG tablet Commonly known as:  SINEMET Take 1 tablet by mouth 5 (five) times daily. 7am, 12, 6pm What changed:  additional instructions   cephALEXin 500 MG capsule Commonly known as:  KEFLEX Take 1 capsule (500 mg total) by mouth 3 (three) times daily.   docusate sodium 100 MG capsule Commonly known as:  COLACE Take 2 capsules (200 mg total) by mouth 2 (two) times daily.   HYDROcodone-acetaminophen 5-325 MG tablet Commonly known as:  NORCO/VICODIN Take 1-2 tablets by mouth every 4 (four) hours as needed for moderate pain.   Melatonin 3 MG Tabs Take 3 mg by mouth at bedtime.   pramipexole 0.25 MG tablet Commonly known as:  MIRAPEX Take 1 tablet (0.25 mg total) by mouth 3 (three) times daily. What changed:  additional instructions   selegiline 5 MG tablet Commonly known as:  ELDEPRYL Take 5 mg by mouth 2 (two) times daily. 0800 & 2000   senna 8.6 MG Tabs tablet Commonly known as:  SENOKOT Take 1-2 tablets (8.6-17.2 mg total) by mouth at bedtime.        Significant Diagnostic Studies:  No results found.  Brief H and P: For complete details please refer to admission H and P, but in brief Tim [name] has an abscess in his scrotum following hydrocelectomy--he is admitted for drainage.  Hospital Course:  Active Problems:   Scrotal abscess He had  an uncomplicated postop course and was discharged on POD 1.  Day of Discharge BP 111/62 (BP Location: Left Arm)   Pulse 79   Temp 98.4 F (36.9 C) (Oral)   Resp 18   SpO2 100%   Results for orders placed or performed during the hospital encounter of 04/24/17 (from the past 24 hour(s))  Basic metabolic panel     Status: Abnormal   Collection Time: 04/24/17  3:07 PM  Result Value Ref Range   Sodium 135 135 - 145 mmol/L   Potassium 4.1 3.5 - 5.1 mmol/L   Chloride 100 (L) 101 - 111 mmol/L   CO2 22 22 - 32 mmol/L   Glucose, Bld 97 65 - 99 mg/dL   BUN 19 6 - 20 mg/dL   Creatinine, Ser 0.96 0.61 - 1.24 mg/dL   Calcium 9.3 8.9 - 10.3 mg/dL   GFR calc non Af Amer >60 >60 mL/min   GFR calc Af Amer >60 >60 mL/min   Anion gap 13 5 - 15  Aerobic/Anaerobic Culture (surgical/deep wound)     Status: None (Preliminary result)   Collection Time: 04/24/17  5:38 PM  Result Value Ref Range   Specimen Description ABSCESS RIGHT SCROTAL    Special Requests NONE    Gram Stain      MODERATE WBC PRESENT, PREDOMINANTLY PMN NO SQUAMOUS EPITHELIAL CELLS SEEN ABUNDANT GRAM POSITIVE COCCI IN PAIRS AND  CHAINS FEW GRAM POSITIVE COCCI IN CLUSTERS FEW GRAM NEGATIVE RODS Performed at [facility], [place] [address] [place], [place] [address]    Culture PENDING    Report Status PENDING   Basic metabolic panel     Status: Abnormal   Collection Time: 04/25/17  4:35 AM  Result Value Ref Range   Sodium 136 135 - 145 mmol/L   Potassium 3.8 3.5 - 5.1 mmol/L   Chloride 100 (L) 101 - 111 mmol/L   CO2 27 22 - 32 mmol/L   Glucose, Bld 113 (H) 65 - 99 mg/dL   BUN 11 6 - 20 mg/dL   Creatinine, Ser 0.84 0.61 - 1.24 mg/dL   Calcium 8.9 8.9 - 10.3 mg/dL   GFR calc non Af Amer >60 >60 mL/min   GFR calc Af Amer >60 >60 mL/min   Anion gap 9 5 - 15    Physical Exam: General: Alert and awake oriented x3 not in any acute distress. HEENT: anicteric sclera, pupils reactive to light and accommodation CVS: S1-S2  clear no murmur rubs or gallops Chest: clear to auscultation bilaterally, no wheezing rales or rhonchi Abdomen: soft nontender, nondistended, normal bowel sounds, no organomegaly Extremities: no cyanosis, clubbing or edema noted bilaterally Neuro: Cranial nerves II-XII intact, no focal neurological deficits  Disposition:  Home  Diet:  Regular  Activity:  Gradually increase   Disposition and Follow-up:    Will followup later this week  TESTS THAT NEED FOLLOW-UP  Culture results  [place]    Franchot Gallo, MD Follow up.   Specialty:  Urology Why:  we will call you to set up appt Contact information: [place] [address] [phone]           Time spent on Discharge:  15 mins  Signed: Jorja Loa 04/25/2017, 7:23 AM

## 2017-04-26 NOTE — Anesthesia Postprocedure Evaluation (Signed)
Anesthesia Post Note  Patient: Tim [name]  Procedure(s) Performed: IRRIGATION AND DEBRIDEMENT SCROTAL ABSCESS (N/A Scrotum)     Patient location during evaluation: PACU Anesthesia Type: General Level of consciousness: awake and alert Pain management: pain level controlled Vital Signs Assessment: post-procedure vital signs reviewed and stable Respiratory status: spontaneous breathing, nonlabored ventilation, respiratory function stable and patient connected to nasal cannula oxygen Cardiovascular status: blood pressure returned to baseline and stable Postop Assessment: no apparent nausea or vomiting Anesthetic complications: no    Last Vitals:  Vitals:   04/25/17 0518 04/25/17 1006  BP: 111/62 117/66  Pulse: 79 95  Resp: 18 18  Temp: 36.9 C 37.3 C  SpO2: 100% 97%    Last Pain:  Vitals:   04/25/17 1006  TempSrc: Oral  PainSc:                  Peola Joynt

## 2017-04-28 DIAGNOSIS — N430 Encysted hydrocele: Secondary | ICD-10-CM | POA: Diagnosis not present

## 2017-04-29 LAB — AEROBIC/ANAEROBIC CULTURE W GRAM STAIN (SURGICAL/DEEP WOUND)

## 2017-04-29 LAB — AEROBIC/ANAEROBIC CULTURE (SURGICAL/DEEP WOUND)

## 2017-05-02 DIAGNOSIS — N430 Encysted hydrocele: Secondary | ICD-10-CM | POA: Diagnosis not present

## 2017-05-02 DIAGNOSIS — R8290 Unspecified abnormal findings in urine: Secondary | ICD-10-CM | POA: Diagnosis not present

## 2017-05-09 DIAGNOSIS — N430 Encysted hydrocele: Secondary | ICD-10-CM | POA: Diagnosis not present

## 2017-05-10 ENCOUNTER — Telehealth: Payer: Self-pay | Admitting: Adult Health

## 2017-05-10 NOTE — Telephone Encounter (Signed)
Pt called said he's had several surgeries and has self increased carbidopa-levodopa (SINEMET) 25-100 MG tablet . He requested to be seen sooner than appt scheduled with Dr D on 11/29 with her or NP. An appt has been scheduled with [facility] tomorrow 11/8 at 3:00.

## 2017-05-11 ENCOUNTER — Encounter (INDEPENDENT_AMBULATORY_CARE_PROVIDER_SITE_OTHER): Payer: Self-pay

## 2017-05-11 ENCOUNTER — Encounter: Payer: Self-pay | Admitting: Adult Health

## 2017-05-11 ENCOUNTER — Ambulatory Visit (INDEPENDENT_AMBULATORY_CARE_PROVIDER_SITE_OTHER): Payer: Medicare Other | Admitting: Adult Health

## 2017-05-11 DIAGNOSIS — G20A1 Parkinson's disease without dyskinesia, without mention of fluctuations: Secondary | ICD-10-CM

## 2017-05-11 DIAGNOSIS — G20 Parkinson's disease: Secondary | ICD-10-CM | POA: Diagnosis not present

## 2017-05-11 MED ORDER — CARBIDOPA-LEVODOPA ER 25-100 MG PO TBCR: 1.0000 | EXTENDED_RELEASE_TABLET | Freq: Two times a day (BID) | ORAL | 5 refills | Status: DC

## 2017-05-11 MED ORDER — CARBIDOPA-LEVODOPA 25-100 MG PO TABS: ORAL_TABLET | ORAL | 5 refills | Status: DC

## 2017-05-11 NOTE — Progress Notes (Signed)
Carbidopa levodopa  refill Rx successfully faxed to [facility], [place]

## 2017-05-11 NOTE — Patient Instructions (Signed)
Your Plan:  Take Sinemet CR at 7am and 7 PM Take Sinemet IR at 11 AM and 4 PM   Continue Selegiline and mirapex    Thank you for coming to see [place] at [facility]. I hope we have been able to provide you high quality care today.  You may receive a patient satisfaction survey over the next few weeks. We would appreciate your feedback and comments so that we may continue to improve ourselves and the health of our patients.
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
## 2017-05-12 NOTE — Progress Notes (Signed)
I agree with the assessment and plan as directed by NP .The patient is known to me . He cannot continue with a self imposed dopamin regimen of 7 times a day- this is causing hyperactivity and gives him a "buzz" .     Norval Slaven, MD

## 2017-05-23 ENCOUNTER — Telehealth: Payer: Self-pay | Admitting: Adult Health

## 2017-05-23 NOTE — Telephone Encounter (Signed)
Called patient; he and wife got on speaker phone. Per Jinny Blossom, advised patient that he will now take the 4 pm dose of Sinemet at 2-2:30 pm when he stated the 11 am dose wears off. Advised that is the only change to his medication regime. Wife stated she wrote down instrucitons; she repeated the directions correctly. Advised they call for any questions, concerns. Patient and wife verbalized understanding, appreciation for call.

## 2017-05-23 NOTE — Telephone Encounter (Signed)
Called patient to discuss. He works from 1-5 pm. He is taking Carbidopa-levo as prescribed at appropriate times and taking Mirapex, Selegiline as prescribed. He stated the medicaiton wears off 2:30-3 pm, and he has shaking and is not doing well. He requests the medication schedule be adjusted so that he can feel better at work.  This RN noted that patient has follow up with Dr Brett Fairy on 06/01/17, however he is asking to either be seen sooner or to get medication adjusted by phone call. This RN stated she will send to [facility] and call him back. He verbalized understanding, appreciation.

## 2017-05-23 NOTE — Telephone Encounter (Signed)
Patient is calling to discuss his medication schedule because it is not working.

## 2017-05-23 NOTE — Telephone Encounter (Signed)
He can take the 4 PM dose sooner to prevent breakthrough symptoms.

## 2017-05-31 ENCOUNTER — Ambulatory Visit: Payer: Medicare Other | Admitting: Neurology

## 2017-05-31 ENCOUNTER — Telehealth: Payer: Self-pay | Admitting: Neurology

## 2017-05-31 NOTE — Telephone Encounter (Signed)
Patient is with wife and the wife was unable to have the patient here to his apt on time. His apt was 8:30 am and they arrived at 8:52 am. At this time Dr Brett Fairy is unable to work them in. I checked Ward Givens, NP schedule and she is also booked. The pt's wife was very tearful and loud in the waiting area demanding to speak with me. I went out to assist with the wife. She just started crying stating that it "takes him 2 hours just to get pants on" and that "she cant live like this" I expressed sympathy for her situation and informed her that I was truly sorry and assured her that if our scheduled weren't already booked we would have gotten them in. The patients wife stated that she had 9 am written on there calendar and I informed her that it was for 8:30 am and that a confirmation VM was left informing them of the time. She explains that the medications aren't working for him. Some days its helpful but other days he is struggling. I informed her that I would make Dr Dohmeier aware of the concerns and if she has recommendations I would let them know.  We were able to offer a 9:30 am apt on Friday morning which they were able to take. She left very tearful and frustrated.

## 2017-06-01 ENCOUNTER — Ambulatory Visit: Payer: Medicare Other | Admitting: Neurology

## 2017-06-02 ENCOUNTER — Encounter: Payer: Self-pay | Admitting: Neurology

## 2017-06-02 ENCOUNTER — Telehealth: Payer: Self-pay | Admitting: Neurology

## 2017-06-02 ENCOUNTER — Ambulatory Visit (INDEPENDENT_AMBULATORY_CARE_PROVIDER_SITE_OTHER): Payer: Medicare Other | Admitting: Neurology

## 2017-06-02 VITALS — BP 114/74 | HR 101 | Ht 67.0 in | Wt 147.0 lb

## 2017-06-02 DIAGNOSIS — G20 Parkinson's disease: Secondary | ICD-10-CM

## 2017-06-02 MED ORDER — CARBIDOPA-LEVODOPA ER 25-100 MG PO TBCR: EXTENDED_RELEASE_TABLET | ORAL | 5 refills | Status: DC

## 2017-06-02 MED ORDER — CARBIDOPA-LEVODOPA 25-100 MG PO TABS: ORAL_TABLET | ORAL | 6 refills | Status: DC

## 2017-06-02 MED ORDER — CARBIDOPA-LEVODOPA ER 50-200 MG PO TBCR: 1.0000 | EXTENDED_RELEASE_TABLET | Freq: Two times a day (BID) | ORAL | 6 refills | Status: DC
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
## 2017-06-02 NOTE — Patient Instructions (Addendum)
I discussed the plan of care with Mr. and Tim [name].  I advised the patient that he should now be taking immediate release Sinemet , beginning at 7 AM-   We will put him on Sinemet CR 50-200 mg to be taken at 9 AM and 1PM  He will take Sinemet IR 25-100 mg at 4 PM and 7 PM  He will remain on Selegiline 5 mg twice a day ( 7 AM and 4 PM )  and Mirapex 0.25 mg  7 PM - take melatonin for REM BD.

## 2017-06-02 NOTE — Telephone Encounter (Signed)
I talked with his wife, very frustrated, both of them are confused how to take his parkinson's medications.  I talked with Dr. Brett Fairy, she will call them

## 2017-06-05 NOTE — Telephone Encounter (Signed)
Resolved issue with [facility].  They received a refill for CR and XR sinemet and I corrected this.  The patient's intake times were spelled out.

## 2017-06-16 DIAGNOSIS — N430 Encysted hydrocele: Secondary | ICD-10-CM | POA: Diagnosis not present

## 2017-06-16 DIAGNOSIS — N400 Enlarged prostate without lower urinary tract symptoms: Secondary | ICD-10-CM | POA: Diagnosis not present

## 2017-07-07 ENCOUNTER — Other Ambulatory Visit: Payer: Self-pay | Admitting: Neurology

## 2017-07-28 DIAGNOSIS — R05 Cough: Secondary | ICD-10-CM | POA: Diagnosis not present

## 2017-08-15 ENCOUNTER — Ambulatory Visit: Payer: Medicare Other | Admitting: Neurology

## 2017-08-15 DIAGNOSIS — H26493 Other secondary cataract, bilateral: Secondary | ICD-10-CM | POA: Diagnosis not present

## 2017-08-15 DIAGNOSIS — H18411 Arcus senilis, right eye: Secondary | ICD-10-CM | POA: Diagnosis not present

## 2017-08-15 DIAGNOSIS — Z961 Presence of intraocular lens: Secondary | ICD-10-CM | POA: Diagnosis not present

## 2017-08-15 DIAGNOSIS — H02839 Dermatochalasis of unspecified eye, unspecified eyelid: Secondary | ICD-10-CM | POA: Diagnosis not present

## 2017-08-15 DIAGNOSIS — H26492 Other secondary cataract, left eye: Secondary | ICD-10-CM | POA: Diagnosis not present

## 2017-08-23 ENCOUNTER — Ambulatory Visit (INDEPENDENT_AMBULATORY_CARE_PROVIDER_SITE_OTHER): Payer: Medicare Other | Admitting: Neurology

## 2017-08-23 ENCOUNTER — Encounter: Payer: Self-pay | Admitting: Neurology

## 2017-08-23 ENCOUNTER — Encounter (INDEPENDENT_AMBULATORY_CARE_PROVIDER_SITE_OTHER): Payer: Self-pay

## 2017-08-23 VITALS — BP 114/65 | HR 64 | Ht 67.0 in | Wt 152.0 lb

## 2017-08-23 DIAGNOSIS — G20 Parkinson's disease: Secondary | ICD-10-CM | POA: Diagnosis not present

## 2017-08-23 MED ORDER — CARBIDOPA-LEVODOPA ER 50-200 MG PO TBCR: 1.0000 | EXTENDED_RELEASE_TABLET | Freq: Two times a day (BID) | ORAL | 6 refills | Status: DC

## 2017-08-23 MED ORDER — CARBIDOPA-LEVODOPA 25-100 MG PO TABS: ORAL_TABLET | ORAL | 6 refills | Status: DC

## 2017-08-23 NOTE — Patient Instructions (Signed)
Carbidopa; Levodopa sustained-release tablets What is this medicine? CARBIDOPA; LEVODOPA (kar bi DOE pa; lee voe DOE pa) is used to treat the symptoms of Parkinson's disease. This medicine may be used for other purposes; ask your health care provider or pharmacist if you have questions. COMMON BRAND NAME(S): SINEMET, SINEMET CR What should I tell my health care provider before I take this medicine? They need to know if you have any of these conditions: -depression or other mental illness -diabetes -glaucoma -heart disease, including history of a heart attack -irregular heart beat -kidney disease -liver disease -lung or breathing disease, like asthma -melanoma or suspicious skin lesions -stomach or intestine ulcers -an unusual or allergic reaction to levodopa, carbidopa, other medicines, foods, dyes, or preservatives -pregnant or trying to get pregnant -breast-feeding How should I use this medicine? Take this medicine by mouth with a glass of water. Follow the directions on the prescription label. Swallow whole. Do not crush or chew. You may cut the tablets in half. Take your doses at regular intervals. Do not take your medicine more often than directed. Do not stop taking except on the advice of your doctor or health care professional. Talk to your pediatrician regarding the use of this medicine in children. Special care may be needed. Overdosage: If you think you have taken too much of this medicine contact a poison control center or emergency room at once. NOTE: This medicine is only for you. Do not share this medicine with others. What if I miss a dose? If you miss a dose, take it as soon as you can. If it is almost time for your next dose, take only that dose. Do not take double or extra doses. What may interact with this medicine? Do not take this medicine with any of the following medications: -MAOIs like Marplan, Nardil, and Parnate -reserpine -tetrabenazine This medicine may  also interact with the following medications: -alcohol -droperidol -entacapone -iron supplements or multivitamins with iron -isoniazid, INH -linezolid -medicines for depression, anxiety, or psychotic disturbances -medicines for high blood pressure -medicines for sleep -metoclopramide -papaverine -procarbazine -tedizolid -rasagiline -selegiline -tolcapone This list may not describe all possible interactions. Give your health care provider a list of all the medicines, herbs, non-prescription drugs, or dietary supplements you use. Also tell them if you smoke, drink alcohol, or use illegal drugs. Some items may interact with your medicine. What should I watch for while using this medicine? Visit your doctor or health care professional for regular checks on your progress. It may be several weeks or months before you feel the full benefits of this medicine. Continue to take your medicine on a regular schedule. Do not take any additional medicines for Parkinson's disease without first consulting with your health care provider. You may get drowsy or dizzy. Do not drive, use machinery, or do anything that needs mental alertness until you know how this drug affects you. Do not stand or sit up quickly, especially if you are an older patient. This reduces the risk of dizzy or fainting spells. Alcohol can make you more drowsy and dizzy. Avoid alcoholic drinks. If you find that you have sudden feelings of wanting to sleep during normal activities, like cooking, watching television, or while driving or riding in a car, you should contact your health care professional. You may experience a "wearing off" effect prior to the time for your next dose of this medicine. You may also experience an "on-off" effect where the medicine apparently stops working for anything from a   minute to several hours, then suddenly starts working again. Tell your doctor or health care professional if any of these symptoms happen to  you. Your dose may need adjustment. A high protein diet can slow or prevent absorption of this medicine. Avoid high protein foods near the time of taking this medicine to help to prevent these problems. Take this medicine at least 30 minutes before eating or one hour after meals. You may want to eat higher protein foods later in the day or in small amounts. Discuss your diet with your doctor or health care professional or nutritionist. If you have diabetes, you may get a false-positive result for sugar in your urine. Check with your doctor or health care professional. This medicine may discolor the urine or sweat, making it look darker or red in color. This is of no cause for concern. However, this may stain clothing or fabrics. There have been reports of increased sexual urges or other strong urges such as gambling while taking some medicines for Parkinson's disease. If you experience any of these urges while taking this medicine, you should report it to your health care provider as soon as possible. You should check your skin often for changes to moles and new growths while taking this medicine. Call your doctor if you notice any of these changes. What side effects may I notice from receiving this medicine? Side effects that you should report to your doctor or health care professional as soon as possible: -allergic reactions like skin rash, itching or hives, swelling of the face, lips, or tongue -anxiety, confusion, or nervousness -falling asleep during normal activities like driving -fast, irregular heartbeat -hallucination, loss of contact with reality -mood changes like aggressive behavior, depression -stomach pain -trouble passing urine -uncontrolled movements of the mouth, head, hands, feet, shoulders, eyelids or other unusual muscle movements Side effects that usually do not require medical attention (report to your doctor or health care professional if they continue or are  bothersome): -headache -loss of appetite -muscle twitches -nausea/vomiting -nightmares, trouble sleeping -unusually weak ot tired This list may not describe all possible side effects. Call your doctor for medical advice about side effects. You may report side effects to FDA at 1-800-FDA-1088. Where should I keep my medicine? Keep out of the reach of children. Store below 30 degrees C (86 degrees F). Keep container tightly closed. Throw away any unused medicine after the expiration date. NOTE: This sheet is a summary. It may not cover all possible information. If you have questions about this medicine, talk to your doctor, pharmacist, or health care provider.  2018 Elsevier/Gold Standard (2013-08-20 15:40:38)  
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
## 2017-09-14 ENCOUNTER — Ambulatory Visit: Payer: Medicare Other | Admitting: Physical Therapy

## 2017-09-14 ENCOUNTER — Ambulatory Visit: Payer: Medicare Other

## 2017-09-14 ENCOUNTER — Ambulatory Visit: Payer: Self-pay | Admitting: Occupational Therapy

## 2017-09-28 ENCOUNTER — Other Ambulatory Visit: Payer: Self-pay | Admitting: Neurology

## 2017-10-03 ENCOUNTER — Ambulatory Visit: Payer: Medicare Other | Admitting: Adult Health

## 2017-10-09 ENCOUNTER — Other Ambulatory Visit: Payer: Self-pay | Admitting: Neurology

## 2017-10-25 DIAGNOSIS — G20 Parkinson's disease: Secondary | ICD-10-CM | POA: Diagnosis not present

## 2017-10-25 DIAGNOSIS — Z79899 Other long term (current) drug therapy: Secondary | ICD-10-CM | POA: Diagnosis not present

## 2017-10-25 DIAGNOSIS — R258 Other abnormal involuntary movements: Secondary | ICD-10-CM | POA: Diagnosis not present

## 2017-11-28 ENCOUNTER — Encounter: Payer: Self-pay | Admitting: Neurology

## 2017-11-28 ENCOUNTER — Ambulatory Visit (INDEPENDENT_AMBULATORY_CARE_PROVIDER_SITE_OTHER): Payer: Medicare Other | Admitting: Neurology

## 2017-11-28 VITALS — BP 116/65 | HR 67 | Ht 67.0 in | Wt 145.0 lb

## 2017-11-28 DIAGNOSIS — F909 Attention-deficit hyperactivity disorder, unspecified type: Secondary | ICD-10-CM

## 2017-11-28 DIAGNOSIS — R490 Dysphonia: Secondary | ICD-10-CM

## 2017-11-28 DIAGNOSIS — G20 Parkinson's disease: Secondary | ICD-10-CM | POA: Diagnosis not present

## 2017-11-28 DIAGNOSIS — Z0000 Encounter for general adult medical examination without abnormal findings: Secondary | ICD-10-CM | POA: Diagnosis not present

## 2017-11-28 DIAGNOSIS — R748 Abnormal levels of other serum enzymes: Secondary | ICD-10-CM | POA: Diagnosis not present

## 2017-11-28 DIAGNOSIS — F419 Anxiety disorder, unspecified: Secondary | ICD-10-CM | POA: Diagnosis not present

## 2017-11-28 DIAGNOSIS — Z79899 Other long term (current) drug therapy: Secondary | ICD-10-CM | POA: Diagnosis not present

## 2017-11-28 DIAGNOSIS — G259 Extrapyramidal and movement disorder, unspecified: Secondary | ICD-10-CM | POA: Diagnosis not present

## 2017-11-28 MED ORDER — CARBIDOPA-LEVODOPA 25-100 MG PO TABS: ORAL_TABLET | ORAL | 6 refills | Status: DC

## 2017-11-28 NOTE — Patient Instructions (Signed)
Deep Brain Stimulation Implantation  Deep brain stimulation (DBS) implantation is a procedure to insert a wire (lead) into one side or both sides of the brain to deliver electrical currents to an area that is causing problems. The lead is attached to a power source that is implanted under the skin near the collarbone, chest, or abdomen. This procedure may be done to treat various medical conditions, such as Parkinson disease, essential tremor, and other neurological conditions that cannot be controlled with medicines.  After the device is implanted, it can be used 24 hours per day. The intensity and frequency of the pulses are programmed for each individual and can be adjusted.  The DBS device has three parts:  · A lead. This is a thin wire. It goes through one or two small openings in your skull. It delivers the electrical pulse.  · A power source. This is called the neurotransmitter. It is usually placed under the skin in the upper part of your collarbone, chest, or abdomen, similar to how a heart pacemaker is inserted. It is powered by a long-lasting battery.  · An extension. This is a wire that connects the lead to the power source. The extension is passed under the skin of your head and neck and down to the power source.    Tell a health care provider about:  · Any allergies you have.  · All medicines you are taking, including vitamins, herbs, eye drops, creams, and over-the-counter medicines.  · Any problems you or family members have had with anesthetic medicines.  · Any blood disorders you have.  · Any surgeries you have had.  · Any medical conditions you have.  What are the risks?  Generally, this is a safe procedure. However, problems may occur, including:  · Infection.  · Bleeding.  · Allergic reactions to medicines or parts of the device.  · Damage to other structures or organs.  · Temporary tingling in the face, arms, or legs.  · Temporary pain or swelling in the areas where the wires are  placed.  · Slight problems with vision or speech.  · Slight problems with balance.  · Slight loss of movement.  · Some jolting or shocking sensations.  · Trouble concentrating.  · Dizziness.  · Confusion.  · Mood changes.  · Trouble sleeping.  · Leaking of fluid from around the brain.  · Headaches.  · Stroke.    What happens before the procedure?  · Follow instructions from your health care provider about eating or drinking restrictions.  · Ask your health care provider about:  ? Changing or stopping your regular medicines. This is especially important if you are taking diabetes medicines or blood thinners.  ? Taking medicines such as aspirin and ibuprofen. These medicines can thin your blood. Do not take these medicines before your procedure if your health care provider instructs you not to.  · Ask your health care provider how your surgical site will be marked or identified.  · You may be given antibiotic medicine to help prevent infection.  · Plan to have someone take you home after the procedure.  What happens during the procedure?  · To reduce your risk of infection:  ? Your health care team will wash or sanitize their hands.  ? Your skin will be washed with soap.  · An area of your scalp will be shaved.  · An IV tube will be inserted into one of your veins.  · You may be given   a medicine to help you relax (sedative).  · The surgery to insert a DBS device will be done in two parts:  ? Inserting the lead.  ? Inserting the neurotransmitter and extension wire.  1. Inserting the Lead  You will be awake during this part of the procedure.  · You will be given a medicine to numb the scalp area (local anesthetic). This will be injected into your scalp.  · Your head will be placed into a head frame to keep your head still.  · One or two small holes will be drilled through your skull.  · A thin tube will be threaded through each hole to place the lead.  · MRI will be used to make a map of your brain. This will show the  part of the brain that needs to be treated.  · The surgeon will ask you various questions. This will help the surgeon to find the most appropriate part of the brain to place the leads.  · As soon as the lead is in the proper position, it will be attached to the extension wire.  · A plastic cap will be placed over each drill hole, and the scalp will be closed with stitches (sutures).    2. Inserting the Neurotransmitter and Extension Wire  This part of the procedure may be done at the same time or later. It may be done on one side or both sides of the head.  · You will be given a medicine to make you fall asleep (general anesthetic).  · A small incision will be made in the skin behind your ear. The extension wire will be inserted through this opening.  · A second incision will be made in the upper part of your collarbone, chest, or abdomen. This is for the neurotransmitter.  · The extension wires will be run under your skin from the area behind your ear to the incision in your upper collarbone, chest, or abdomen.  · In the area of your collarbone, chest, or abdomen, a pocket will be made under the skin to hold the stopwatch-size neurotransmitter.  · The incision will be closed with sutures or staples. A dressing will be applied.    The procedure may vary among health care providers and hospitals.  What happens after the procedure?  · Your blood pressure, heart rate, breathing rate, and blood oxygen level will be monitored often until the medicines you were given have worn off.  · You will be given pain medicine as needed.  · Do not drive for 24 hours if you received a sedative.  · You will need to return for a follow-up visit to have your device programmed.  This information is not intended to replace advice given to you by your health care provider. Make sure you discuss any questions you have with your health care provider.  Document Released: 10/05/2010 Document Revised: 11/26/2015 Document Reviewed:  12/17/2014  Elsevier Interactive Patient Education © 2018 Elsevier [employer]
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
## 2017-11-29 ENCOUNTER — Telehealth: Payer: Self-pay | Admitting: Neurology

## 2017-11-29 NOTE — Telephone Encounter (Signed)
Medicare/aarp order sent to [facility]. No auth they will reach out to the pt to schedule.  °

## 2017-12-06 ENCOUNTER — Observation Stay (HOSPITAL_COMMUNITY)
Admission: EM | Admit: 2017-12-06 | Discharge: 2017-12-07 | Disposition: A | Discharge status: Home / Self Care | Payer: Medicare Other | Attending: Family Medicine | Admitting: Family Medicine

## 2017-12-06 ENCOUNTER — Emergency Department (HOSPITAL_COMMUNITY): Payer: Medicare Other

## 2017-12-06 ENCOUNTER — Other Ambulatory Visit: Payer: Self-pay

## 2017-12-06 ENCOUNTER — Encounter (HOSPITAL_COMMUNITY): Payer: Self-pay

## 2017-12-06 DIAGNOSIS — R531 Weakness: Secondary | ICD-10-CM | POA: Diagnosis not present

## 2017-12-06 DIAGNOSIS — G20 Parkinson's disease: Secondary | ICD-10-CM | POA: Diagnosis not present

## 2017-12-06 DIAGNOSIS — E785 Hyperlipidemia, unspecified: Secondary | ICD-10-CM | POA: Insufficient documentation

## 2017-12-06 DIAGNOSIS — Z79899 Other long term (current) drug therapy: Secondary | ICD-10-CM | POA: Diagnosis not present

## 2017-12-06 DIAGNOSIS — R490 Dysphonia: Secondary | ICD-10-CM | POA: Diagnosis not present

## 2017-12-06 DIAGNOSIS — M542 Cervicalgia: Secondary | ICD-10-CM | POA: Diagnosis not present

## 2017-12-06 DIAGNOSIS — I639 Cerebral infarction, unspecified: Secondary | ICD-10-CM | POA: Diagnosis not present

## 2017-12-06 DIAGNOSIS — R4020 Unspecified coma: Secondary | ICD-10-CM | POA: Diagnosis not present

## 2017-12-06 DIAGNOSIS — N390 Urinary tract infection, site not specified: Secondary | ICD-10-CM

## 2017-12-06 LAB — CBC WITH DIFFERENTIAL/PLATELET
Abs Immature Granulocytes: 0 10*3/uL (ref 0.0–0.1)
BASOS PCT: 1 %
Basophils Absolute: 0.1 10*3/uL (ref 0.0–0.1)
EOS ABS: 0.3 10*3/uL (ref 0.0–0.7)
Eosinophils Relative: 5 %
HEMATOCRIT: 43.2 % (ref 39.0–52.0)
Hemoglobin: 13.5 g/dL (ref 13.0–17.0)
Immature Granulocytes: 0 %
LYMPHS ABS: 2.2 10*3/uL (ref 0.7–4.0)
Lymphocytes Relative: 35 %
MCH: 28.1 pg (ref 26.0–34.0)
MCHC: 31.3 g/dL (ref 30.0–36.0)
MCV: 90 fL (ref 78.0–100.0)
MONOS PCT: 8 %
Monocytes Absolute: 0.5 10*3/uL (ref 0.1–1.0)
Neutro Abs: 3.1 10*3/uL (ref 1.7–7.7)
Neutrophils Relative %: 51 %
PLATELETS: 224 10*3/uL (ref 150–400)
RBC: 4.8 MIL/uL (ref 4.22–5.81)
RDW: 13.9 % (ref 11.5–15.5)
WBC: 6.3 10*3/uL (ref 4.0–10.5)

## 2017-12-06 LAB — COMPREHENSIVE METABOLIC PANEL
ALBUMIN: 3.8 g/dL (ref 3.5–5.0)
ALT: 5 U/L — ABNORMAL LOW (ref 17–63)
ANION GAP: 8 (ref 5–15)
AST: 20 U/L (ref 15–41)
Alkaline Phosphatase: 61 U/L (ref 38–126)
BUN: 14 mg/dL (ref 6–20)
CHLORIDE: 107 mmol/L (ref 101–111)
CO2: 27 mmol/L (ref 22–32)
Calcium: 9.4 mg/dL (ref 8.9–10.3)
Creatinine, Ser: 0.86 mg/dL (ref 0.61–1.24)
GFR calc Af Amer: >60 mL/min (ref >60)
GFR calc non Af Amer: >60 mL/min (ref >60)
GLUCOSE: 104 mg/dL — AB (ref 65–99)
POTASSIUM: 4.1 mmol/L (ref 3.5–5.1)
Sodium: 142 mmol/L (ref 135–145)
Total Bilirubin: 0.6 mg/dL (ref 0.3–1.2)
Total Protein: 6.3 g/dL — ABNORMAL LOW (ref 6.5–8.1)

## 2017-12-06 LAB — PROTIME-INR
INR: 0.95
Prothrombin Time: 12.6 seconds (ref 11.4–15.2)

## 2017-12-06 LAB — MAGNESIUM: MAGNESIUM: 1.9 mg/dL (ref 1.7–2.4)

## 2017-12-06 LAB — URINALYSIS, ROUTINE W REFLEX MICROSCOPIC
BILIRUBIN URINE: NEGATIVE
GLUCOSE, UA: NEGATIVE mg/dL
HGB URINE DIPSTICK: NEGATIVE
Ketones, ur: 5 mg/dL — AB
NITRITE: POSITIVE — AB
Protein, ur: NEGATIVE mg/dL
Specific Gravity, Urine: 1.018 (ref 1.005–1.030)
pH: 7 (ref 5.0–8.0)

## 2017-12-06 LAB — TROPONIN I: Troponin I: <0.03 ng/mL (ref <0.03)

## 2017-12-06 MED ORDER — CARBIDOPA-LEVODOPA 25-100 MG PO TABS
1.0000 | ORAL_TABLET | Freq: Three times a day (TID) | ORAL | Status: DC
  Filled 2017-12-06: qty 1

## 2017-12-06 MED ORDER — ONDANSETRON HCL 4 MG PO TABS: 4.0000 mg | ORAL_TABLET | Freq: Four times a day (QID) | ORAL | Status: DC | PRN

## 2017-12-06 MED ORDER — BISACODYL 10 MG RE SUPP: 10.0000 mg | Freq: Every day | RECTAL | Status: DC | PRN

## 2017-12-06 MED ORDER — MELATONIN 3 MG PO TABS
3.0000 mg | ORAL_TABLET | Freq: Every day | ORAL | Status: DC
  Administered 2017-12-06: 3 mg via ORAL
  Filled 2017-12-06: qty 1

## 2017-12-06 MED ORDER — FLUTICASONE PROPIONATE 50 MCG/ACT NA SUSP
2.0000 | Freq: Every day | NASAL | Status: DC
  Administered 2017-12-06 – 2017-12-07 (×2): 2 via NASAL
  Filled 2017-12-06: qty 16

## 2017-12-06 MED ORDER — CARBIDOPA-LEVODOPA 25-100 MG PO TABS
1.0000 | ORAL_TABLET | ORAL | Status: DC
  Administered 2017-12-06 – 2017-12-07 (×5): 1 via ORAL
  Filled 2017-12-06 (×7): qty 1

## 2017-12-06 MED ORDER — CARBIDOPA-LEVODOPA 25-100 MG PO TABS
1.0000 | ORAL_TABLET | ORAL | Status: AC
  Administered 2017-12-06: 1 via ORAL
  Filled 2017-12-06: qty 1

## 2017-12-06 MED ORDER — CARBIDOPA-LEVODOPA 25-100 MG PO TABS
1.0000 | ORAL_TABLET | Freq: Every day | ORAL | Status: DC
  Filled 2017-12-06 (×3): qty 1

## 2017-12-06 MED ORDER — CEPHALEXIN 250 MG PO CAPS
500.0000 mg | ORAL_CAPSULE | Freq: Once | ORAL | Status: AC
  Administered 2017-12-06: 500 mg via ORAL
  Filled 2017-12-06: qty 2

## 2017-12-06 MED ORDER — ENOXAPARIN SODIUM 40 MG/0.4ML ~~LOC~~ SOLN: 40.0000 mg | SUBCUTANEOUS | Status: DC

## 2017-12-06 MED ORDER — GADOBENATE DIMEGLUMINE 529 MG/ML IV SOLN
15.0000 mL | Freq: Once | INTRAVENOUS | Status: AC | PRN
  Administered 2017-12-06: 15 mL via INTRAVENOUS

## 2017-12-06 MED ORDER — ENOXAPARIN SODIUM 40 MG/0.4ML ~~LOC~~ SOLN
40.0000 mg | SUBCUTANEOUS | Status: DC
  Administered 2017-12-06: 40 mg via SUBCUTANEOUS
  Filled 2017-12-06: qty 0.4

## 2017-12-06 MED ORDER — ACETAMINOPHEN 325 MG PO TABS
650.0000 mg | ORAL_TABLET | Freq: Four times a day (QID) | ORAL | Status: DC | PRN
  Administered 2017-12-06 – 2017-12-07 (×2): 650 mg via ORAL
  Filled 2017-12-06 (×2): qty 2

## 2017-12-06 MED ORDER — SELEGILINE HCL 5 MG PO TABS
5.0000 mg | ORAL_TABLET | Freq: Two times a day (BID) | ORAL | Status: DC
  Administered 2017-12-07: 5 mg via ORAL
  Filled 2017-12-06 (×2): qty 1

## 2017-12-06 MED ORDER — ONDANSETRON HCL 4 MG/2ML IJ SOLN: 4.0000 mg | Freq: Four times a day (QID) | INTRAMUSCULAR | Status: DC | PRN

## 2017-12-06 MED ORDER — CEPHALEXIN 500 MG PO CAPS
500.0000 mg | ORAL_CAPSULE | Freq: Three times a day (TID) | ORAL | Status: DC
  Administered 2017-12-06 – 2017-12-07 (×2): 500 mg via ORAL
  Filled 2017-12-06 (×2): qty 1

## 2017-12-06 MED ORDER — BUSPIRONE HCL 15 MG PO TABS
7.5000 mg | ORAL_TABLET | Freq: Every day | ORAL | Status: DC
  Administered 2017-12-06: 7.5 mg via ORAL
  Filled 2017-12-06: qty 1

## 2017-12-06 MED ORDER — ACETAMINOPHEN 650 MG RE SUPP: 650.0000 mg | Freq: Four times a day (QID) | RECTAL | Status: DC | PRN

## 2017-12-06 MED ORDER — SENNOSIDES-DOCUSATE SODIUM 8.6-50 MG PO TABS: 1.0000 | ORAL_TABLET | Freq: Every evening | ORAL | Status: DC | PRN

## 2017-12-06 MED ORDER — PRAMIPEXOLE DIHYDROCHLORIDE 0.125 MG PO TABS
0.2500 mg | ORAL_TABLET | Freq: Three times a day (TID) | ORAL | Status: DC
  Administered 2017-12-06 – 2017-12-07 (×3): 0.25 mg via ORAL
  Filled 2017-12-06 (×2): qty 1
  Filled 2017-12-06: qty 2
  Filled 2017-12-06: qty 1
  Filled 2017-12-06: qty 2

## 2017-12-06 MED ORDER — SODIUM CHLORIDE 0.9 % IV SOLN
INTRAVENOUS | Status: DC
  Administered 2017-12-06: 09:00:00 via INTRAVENOUS

## 2017-12-06 NOTE — ED Notes (Signed)
Neuro at bedside.

## 2017-12-06 NOTE — ED Notes (Signed)
To MRI

## 2017-12-06 NOTE — ED Notes (Signed)
Levo dopa given at this time.

## 2017-12-06 NOTE — ED Notes (Signed)
Interviewed patient concerning MRI, responds that he is not claustrophobic and that he has had an MRI before without experiencing anxiety. Communicated this to MRI staff.
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
## 2017-12-06 NOTE — ED Notes (Signed)
Dr. Sabra Heck at bedside- pt has recently changed his sinemet dose after seeing Dr. Roddie Mc last week-taking 5 doses - specific dose regiment followed at home.

## 2017-12-06 NOTE — ED Notes (Signed)
Daughter -- [phone] Jenny Reichmann or Caren Griffins)

## 2017-12-06 NOTE — ED Notes (Signed)
Meal brought to pt and sat up in bed. Pt eating at this time with family in room.
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
## 2017-12-06 NOTE — ED Triage Notes (Addendum)
Per [facility], pt woke up around 0530 with left sided weakness. EMS reports pt always weak on left side r/t hx of parkinsons, but weakness was increased. Upon EMS arrival pt was weak on both sides. Pt grips are equal. Family reports pt woke up stating "I'm having a heart attack." EMS reports EKG unremarkable. Pt alert and oriented x4. Pt reports bilateral leg numbness. Pt reports it is normal for his legs to be numb sometimes, but not at the same time. Pt reports he can become numb when his medicine wears off.

## 2017-12-06 NOTE — ED Notes (Signed)
ED Provider at bedside. 

## 2017-12-07 ENCOUNTER — Telehealth: Payer: Self-pay | Admitting: Neurology

## 2017-12-07 DIAGNOSIS — R531 Weakness: Secondary | ICD-10-CM | POA: Diagnosis not present

## 2017-12-07 DIAGNOSIS — R490 Dysphonia: Secondary | ICD-10-CM | POA: Diagnosis not present

## 2017-12-07 LAB — CBC
HEMATOCRIT: 46.9 % (ref 39.0–52.0)
Hemoglobin: 15.2 g/dL (ref 13.0–17.0)
MCH: 29 pg (ref 26.0–34.0)
MCHC: 32.4 g/dL (ref 30.0–36.0)
MCV: 89.5 fL (ref 78.0–100.0)
Platelets: 251 10*3/uL (ref 150–400)
RBC: 5.24 MIL/uL (ref 4.22–5.81)
RDW: 13.7 % (ref 11.5–15.5)
WBC: 8.9 10*3/uL (ref 4.0–10.5)

## 2017-12-07 LAB — BASIC METABOLIC PANEL
Anion gap: 8 (ref 5–15)
BUN: 9 mg/dL (ref 6–20)
CHLORIDE: 107 mmol/L (ref 101–111)
CO2: 24 mmol/L (ref 22–32)
CREATININE: 0.77 mg/dL (ref 0.61–1.24)
Calcium: 9.7 mg/dL (ref 8.9–10.3)
GFR calc Af Amer: >60 mL/min (ref >60)
GFR calc non Af Amer: >60 mL/min (ref >60)
GLUCOSE: 96 mg/dL (ref 65–99)
POTASSIUM: 4.1 mmol/L (ref 3.5–5.1)
Sodium: 139 mmol/L (ref 135–145)

## 2017-12-07 MED ORDER — CARBIDOPA-LEVODOPA 25-100 MG PO TABS: ORAL_TABLET | ORAL | 0 refills | Status: DC

## 2017-12-07 MED ORDER — SULFAMETHOXAZOLE-TRIMETHOPRIM 800-160 MG PO TABS: 1.0000 | ORAL_TABLET | Freq: Two times a day (BID) | ORAL | 0 refills | Status: AC

## 2017-12-07 NOTE — Progress Notes (Signed)
Discharge instruction reviewed with patient/family. All questions answered at this time. RX given. Awaiting for transportation from daughter.   Ave Filter, RN

## 2017-12-07 NOTE — Care Management Note (Signed)
Case Management Note  Patient Details  Name: Tim [name] MRN: [id number] Date of Birth: [birth year]  Subjective/Objective:    Pt in with hypokinetic parkinsonian dysphonia. He is from home with his spouse.              Action/Plan: Pt discharging home with self care. No [name] per PT/OT.  Pt has PCP: Dr Harrington Challenger Insurance: Medicare Pt has transportation home.   Expected Discharge Date:  12/07/17               Expected Discharge Plan:  Home/Self Care  In-House Referral:     Discharge planning Services     Post Acute Care Choice:    Choice offered to:     DME Arranged:    DME Agency:     HH Arranged:    HH Agency:     Status of Service:  Completed, signed off  If discussed at [employer] of Stay Meetings, dates discussed:    Additional Comments:  Pollie Friar, RN 12/07/2017, 11:46 AM

## 2017-12-07 NOTE — Progress Notes (Signed)
PT Cancellation Note  Patient Details Name: Tim [name] MRN: [id number] DOB: Nov 12, 1945   Cancelled Treatment:    Reason Eval/Treat Not Completed: PT screened, no needs identified, will sign off  OT screening patient for PT needs. OT discussing with PT that patient is at baseline for functional mobility as well as able to perform stair navigation without issue today. PT speaking with patient with patient reporting return of normal mobility. No PT needs identified at this time. PT to sign off.   Lanney Gins, PT, DPT 12/07/17 10:20 AM   Lanney Gins 12/07/2017, 10:19 AM

## 2017-12-07 NOTE — Telephone Encounter (Signed)
Called to offer the pt a time slot on Friday Dec 15 2017. Either 10,10:30 or 11:30 pm. When pt wife calls back please have her inform which time slot she would like and once the schedule has been opened I can plug into that slot. I can also call her if something else opens up next wk but would be a last minute cancellation.

## 2017-12-07 NOTE — Progress Notes (Signed)
Subjective: States he is significantly improved.  Objective: Current vital signs: BP 101/64 (BP Location: Left Arm)   Pulse 81   Temp 97.8 F (36.6 C) (Oral)   Resp 18   Ht _0  (1.702 m)   Wt 66.2 kg (145 lb 15.1 oz)   SpO2 97%   BMI 22.86 kg/m  Vital signs in last 24 hours: Temp:  [97.7 F (36.5 C)-98.8 F (37.1 C)] 97.8 F (36.6 C) (06/06 0825) Pulse Rate:  [63-86] 81 (06/06 0825) Resp:  [12-23] 18 (06/06 0825) BP: (101-140)/(62-89) 101/64 (06/06 0825) SpO2:  [95 %-97 %] 97 % (06/06 0825) Weight:  [66.2 kg (145 lb 15.1 oz)] 66.2 kg (145 lb 15.1 oz) (06/06 0500)  Intake/Output from previous day: 06/05 0701 - 06/06 0700 In: -  Out: 1350 [Urine:1350] Intake/Output this shift: Total I/O In: -  Out: 200 [Urine:200] Nutritional status:  Diet Order           Diet regular Room service appropriate? Yes; Fluid consistency: Thin  Diet effective now          Mental Status: Brighter affect today with resolution of hypomimia. Alert, thought content appropriate.  Speech fluent without evidence of aphasia.  Bradykinetic speech pattern seen yesterday is essentially resolved. Able to follow all commands without difficulty. Cranial Nerves: II: Fixates and tracks normally.   III,IV, VI: Mild intermittent bilateral ptosis seen yesterday is now resolved. Gaze is conjugated.  VII: Face symmetric VIII: hearing intact to voice IX,X: Mild hypophonia noted yesterday is almost completely resolved XI: No asymmetry XII: midline tongue extension Motor: Right :  Upper extremity   5/5                                      Left:     Upper extremity   5/5             Lower extremity   5/5                                                  Lower extremity   5/5 Bradykinetic x 4 but significantly less so than yesterday. Cogwheel rigidity of upper extremities seen yesterday is essentially resolved (just received second of his 7 daily Sinemet doses). Of note, there is now some trunk, shoulder, neck  and arm dyskinesia but this is relatively mild.  Sensory: FT intact x 4 Cerebellar: No ataxia with FNF bilaterally.  Gait: Deferred    Lab Results: Results for orders placed or performed during the hospital encounter of 12/06/17 (from the past 48 hour(s))  CBC with Differential/Platelet     Status: None   Collection Time: 12/06/17  7:09 AM  Result Value Ref [place]   WBC 6.3 4.0 - 10.5 K/uL   RBC 4.80 4.22 - 5.81 MIL/uL   Hemoglobin 13.5 13.0 - 17.0 g/dL   HCT 43.2 39.0 - 52.0 %   MCV 90.0 78.0 - 100.0 fL   MCH 28.1 26.0 - 34.0 pg   MCHC 31.3 30.0 - 36.0 g/dL   RDW 13.9 11.5 - 15.5 %   Platelets 224 150 - 400 K/uL   Neutrophils Relative % 51 %   Neutro Abs 3.1 1.7 - 7.7 K/uL   Lymphocytes Relative 35 %  Lymphs Abs 2.2 0.7 - 4.0 K/uL   Monocytes Relative 8 %   Monocytes Absolute 0.5 0.1 - 1.0 K/uL   Eosinophils Relative 5 %   Eosinophils Absolute 0.3 0.0 - 0.7 K/uL   Basophils Relative 1 %   Basophils Absolute 0.1 0.0 - 0.1 K/uL   Immature Granulocytes 0 %   Abs Immature Granulocytes 0.0 0.0 - 0.1 K/uL    Comment: Performed at [facility], [place] [address], [place], [place] [address]  Comprehensive metabolic panel     Status: Abnormal   Collection Time: 12/06/17  7:09 AM  Result Value Ref [place]   Sodium 142 135 - 145 mmol/L   Potassium 4.1 3.5 - 5.1 mmol/L   Chloride 107 101 - 111 mmol/L   CO2 27 22 - 32 mmol/L   Glucose, Bld 104 (H) 65 - 99 mg/dL   BUN 14 6 - 20 mg/dL   Creatinine, Ser 0.86 0.61 - 1.24 mg/dL   Calcium 9.4 8.9 - 10.3 mg/dL   Total Protein 6.3 (L) 6.5 - 8.1 g/dL   Albumin 3.8 3.5 - 5.0 g/dL   AST 20 15 - 41 U/L   ALT 5 (L) 17 - 63 U/L   Alkaline Phosphatase 61 38 - 126 U/L   Total Bilirubin 0.6 0.3 - 1.2 mg/dL   GFR calc non Af Amer >60 >60 mL/min   GFR calc Af Amer >60 >60 mL/min    Comment: (NOTE) The eGFR has been calculated using the CKD EPI equation. This calculation has not been validated in all clinical situations. eGFR's persistently  <60 mL/min signify possible Chronic Kidney Disease.    Anion gap 8 5 - 15    Comment: Performed at [facility] [address] [place], [place] [address]  Protime-INR     Status: None   Collection Time: 12/06/17  7:09 AM  Result Value Ref [place]   Prothrombin Time 12.6 11.4 - 15.2 seconds   INR 0.95     Comment: Performed at [facility] [address], [place], [place] [address]  Magnesium     Status: None   Collection Time: 12/06/17  7:09 AM  Result Value Ref [place]   Magnesium 1.9 1.7 - 2.4 mg/dL    Comment: Performed at [facility] [address], [place], [place] [address]  Troponin I     Status: None   Collection Time: 12/06/17  7:09 AM  Result Value Ref [place]   Troponin I <0.03 <0.03 ng/mL    Comment: Performed at [facility] [address] [place], [place] [address]  Urinalysis, Routine w reflex microscopic     Status: Abnormal   Collection Time: 12/06/17  8:48 AM  Result Value Ref [place]   Color, Urine YELLOW YELLOW   APPearance CLEAR CLEAR   Specific Gravity, Urine 1.018 1.005 - 1.030   pH 7.0 5.0 - 8.0   Glucose, UA NEGATIVE NEGATIVE mg/dL   Hgb urine dipstick NEGATIVE NEGATIVE   Bilirubin Urine NEGATIVE NEGATIVE   Ketones, ur 5 (A) NEGATIVE mg/dL   Protein, ur NEGATIVE NEGATIVE mg/dL   Nitrite POSITIVE (A) NEGATIVE   Leukocytes, UA SMALL (A) NEGATIVE   RBC / HPF 0-5 0 - 5 RBC/hpf   WBC, UA 21-50 0 - 5 WBC/hpf   Bacteria, UA RARE (A) NONE SEEN   Squamous Epithelial / LPF 0-5 0 - 5   Mucus PRESENT     Comment: Performed at  [facility], [place] [address], [place], [place] [address]  Basic metabolic panel     Status: None   Collection Time: 12/07/17  3:19 AM  Result Value Ref [place]   Sodium 139 135 - 145 mmol/L   Potassium 4.1 3.5 - 5.1 mmol/L   Chloride 107 101 - 111 mmol/L   CO2 24 22 - 32 mmol/L   Glucose, Bld 96 65 - 99 mg/dL   BUN 9 6 - 20 mg/dL   Creatinine, Ser 0.77 0.61 - 1.24 mg/dL   Calcium 9.7 8.9 - 10.3 mg/dL    GFR calc non Af Amer >60 >60 mL/min   GFR calc Af Amer >60 >60 mL/min    Comment: (NOTE) The eGFR has been calculated using the CKD EPI equation. This calculation has not been validated in all clinical situations. eGFR's persistently <60 mL/min signify possible Chronic Kidney Disease.    Anion gap 8 5 - 15    Comment: Performed at [facility] [address] [place] [address]  CBC     Status: None   Collection Time: 12/07/17  3:19 AM  Result Value Ref [place]   WBC 8.9 4.0 - 10.5 K/uL   RBC 5.24 4.22 - 5.81 MIL/uL   Hemoglobin 15.2 13.0 - 17.0 g/dL   HCT 46.9 39.0 - 52.0 %   MCV 89.5 78.0 - 100.0 fL   MCH 29.0 26.0 - 34.0 pg   MCHC 32.4 30.0 - 36.0 g/dL   RDW 13.7 11.5 - 15.5 %   Platelets 251 150 - 400 K/uL    Comment: Performed at [facility], [place] [address], [place], [place] [address]    No results found for this or any previous visit (from the past 240 hour(s)).  Lipid Panel No results for input(s): CHOL, TRIG, HDL, CHOLHDL, VLDL, LDLCALC in the last 72 hours.  Studies/Results: Ct Head Wo Contrast  Result Date: 12/06/2017 CLINICAL DATA:  Left-sided weakness.  History of Parkinson's disease EXAM: CT HEAD WITHOUT CONTRAST TECHNIQUE: Contiguous axial images were obtained from the base of the skull through the vertex without intravenous contrast. COMPARISON:  Head CT March 09, 2007; brain MRI March 09, 2007 FINDINGS: Brain: The ventricles are normal in size and configuration. There is mild sulcal and cerebellar region atrophy. There is no mass hemorrhage extra-axial fluid collection, or midline shift. There is decreased attenuation throughout much of the dentate nucleus of the right cerebellum, a finding not present previously and consistent with potential recent/acute infarct involving this portion of the cerebellum. There is no effacement of the fourth ventricle. Elsewhere, gray-white compartments appear unremarkable. Basal ganglia calcification is felt  to be physiologic and is stable. Vascular: No hyperdense vessels are evident. There are foci of calcification in each carotid siphon region. Skull: Bony calvarium appears intact. Sinuses/Orbits: There is mucosal thickening in the maxillary antra bilaterally. There is opacification in multiple ethmoid air cells. There is mucosal thickening in the anterior aspect of each sphenoid sinus as well as in the frontal sinus regions bilaterally. Orbits appear symmetric bilaterally. Other: Patient has had previous mastoidectomy on the right. Mastoids on the left which are visualized are clear. IMPRESSION: 1. Decreased attenuation in the dentate nucleus on the right compared to the left, a finding not present previously and concerning for potential recent/acute infarct. No other findings concerning for potential acute infarct. No demonstrable mass or hemorrhage. Basal ganglia calcification is felt be physiologic. 2. There is a degree of sulcal  in cerebellar atrophy. Ventricles appear normal in size and configuration. 3.  There are foci of arterial vascular calcification. 4. Multifocal paranasal sinus disease, most severe in the ethmoid air cell regions. 5.  Previous mastoidectomy on the right. Electronically Signed   By: Lowella Grip III M.D.   On: 12/06/2017 07:53   Mr Jeri Cos And Wo Contrast  Result Date: 12/06/2017 CLINICAL DATA:  72 year old male with left side weakness. Altered level of consciousness. EXAM: MRI HEAD WITHOUT AND WITH CONTRAST TECHNIQUE: Multiplanar, multiecho pulse sequences of the brain and surrounding structures were obtained without and with intravenous contrast. CONTRAST:  15 milliliters MultiHance. COMPARISON:  Head CT without contrast 0745 hours today. Brain MRI 03/09/2007. FINDINGS: Brain: No restricted diffusion to suggest acute infarction. No midline shift, mass effect, evidence of mass lesion, ventriculomegaly, extra-axial collection or acute intracranial hemorrhage. Cervicomedullary  junction and pituitary are within normal limits. Cerebral volume has not significantly changed since 2008 and remains within normal limits for age. Pearline Cables and white matter signal is within normal limits for age throughout the brain. No cortical or deep gray matter encephalomalacia identified. No chronic cerebral blood products. The brainstem and cerebellum appear normal for age. No abnormal enhancement identified. No dural thickening. Vascular: Major intracranial vascular flow voids are stable since 2008 aside from increased chronic tortuosity of the cavernous ICA segments. The major dural venous sinuses are enhancing and appear patent. Skull and upper cervical spine: Degenerative appearing ligamentous hypertrophy about the odontoid has progressed since 2008. Otherwise negative visible upper cervical spine. Normal bone marrow signal. Sinuses/Orbits: Postoperative changes to both globes. Otherwise normal orbits soft tissues. Bilateral paranasal sinus and nasal cavity mucosal thickening and enhancement. No sinus fluid levels or bubbly opacity. Questionable increased nasopharyngeal mucosal space thickening and enhancement also. Trace retained secretions in the nasopharynx. Other: The mastoid air cells remain clear. Visible internal auditory structures appear normal. Scalp and face soft tissues appear negative. IMPRESSION: 1. Normal for age MRI appearance of the brain. 2. Generalized sinonasal mucosal thickening and hyperenhancement, and questionable associated similar nasopharynx mucosal inflammation. Consider acute URI. Electronically Signed   By: Genevie Ann M.D.   On: 12/06/2017 12:46    Medications:  Scheduled: . busPIRone  7.5 mg Oral QHS  . carbidopa-levodopa  1 tablet Oral 7 times per day  . cephALEXin  500 mg Oral Q8H  . enoxaparin (LOVENOX) injection  40 mg Subcutaneous Q24H  . fluticasone  2 spray Each Nare Daily  . Melatonin  3 mg Oral QHS  . pramipexole  0.25 mg Oral TID  . selegiline  5 mg Oral BID    Continuous: . sodium chloride 100 mL/hr at 12/06/17 1531    Assessment: 72 year old male Parkinson's patient with sudden onset freezing 2 weeks following decreased Sinemet dosing. He is significantly improved today with the increased dosing frequency of Sinemet prescribed yesterday.  1. Discussed case with Dr. Brett Fairy yesterday. The patient's freezing is most likely due to delayed dopamine receptor dynamics after decreased dosing regimen was started 2 weeks ago.  2. Comorbidities for this patient's Parkinson's disease include rapid on/off effects of medication, anosmia and REM sleep behavior disorder. 3. The patient expressed satisfaction with the results of his new regimen. He is also interested in pursuing the Duopa pump when he meets again with Dr. Brett Fairy. His wife also seemed more likely to be in consensus agreement with this plan, stating yesterday that she was more afraid of DBS surgery than she would be for a  peritoneal pump placement.   Recommendations: 1. Continue with new dosing regimen of Sinemet at 7x/day (changed from 5x/day)             -6 AM Sinemet IR 25/100 one tab             -8 AM Sinemet IR 25/100  one tab             -10 PM Sinemet IR 25/100 one tab             -12 PM Sinemet IR 25/100 one tab             -2 PM Sinemet IR 25/100 one tab             -4 PM Sinemet IR 25/100 one tab             -7 PM Sinemet IR 25/100 one tab 2. From Neurological standpoint, can be discharged home today on new Sinemet regimen above.  3. Follow up with Dr. Brett Fairy as outpatient     LOS: 0 days   _0  signed: Dr. Kerney Elbe 12/07/2017  9:11 AM

## 2017-12-07 NOTE — Discharge Summary (Signed)
Physician Discharge Summary  Tim [name]:[id number] DOB: 04-28-1946 DOA: 12/06/2017  PCP: Lawerance Cruel, MD  Admit date: 12/06/2017 Discharge date: 12/07/2017  Time spent: 40 minutes  Recommendations for Outpatient Follow-up:  1. Follow up outpatient CBC/CMP 2. Follow up symptoms as outpatient on sinemet 7x daily regimen prescribed below 3. Ensure follow up with neurology 4. Pt with UTI, growing pseudomonas.  Sensitivities not available yet, but discharged on bactrim.  Called pt at numbers listed, planned to call in cipro, but pt did not answer.  Left message.  Called back on 6/7 and discussed with wife, sent in cipro 500 mg BID x5 days (sensitivies back and sensitive to this).  I called this in to the [facility].  Discharge Diagnoses:  Principal Problem:   Hypokinetic Parkinsonian dysphonia Active Problems:   PD (Parkinson's disease) ([place])   Generalized weakness   Discharge Condition: stable  Diet recommendation: heart healthy  Filed Weights   12/06/17 0650 12/07/17 0500  Weight: 65.8 kg (145 lb) 66.2 kg (145 lb 15.1 oz)    History of present illness:  Per HPI Tim [name] is a 72 y.o. male with medical history significant for Parkinson's disease, anxiety, who presents with complaints of generalized weakness since this morning.  His wife who is at bedside, reports that when he woke up this morning, he had difficulty moving, unable to open his eyes, despite having taken his Parkinson's medication.  He reports never missing a dose, however, 1 week ago, he is Sinemet dose, has been adjusted and reduced, due to hyperkinesis, and apparent good response, going back to his baseline.  He was brought to the ED for further evaluation as he appears to be weaker.  There is no lateralizing weakness, he feels very weak.  He had some intermittent numbness, not on at the time of evaluation.  He denies difficulty with speech, denies shortness of breath or cough, denies chest  pain or palpitations.  He denies any fever or chills, nausea or vomiting.  He denies any diarrhea. Denies headache or blurred vision.  Of note, the patient indeed notices increased frequency, and hesitancy since last night after returning from a trip to [place], flying by plane.  He denies any hematuria. Denies leg swelling or calf pain.   Pt presented with motor freezing.  He was seen by neurology who discussed the case with his primary neurologist.  Apparently pt had recent change to sinimet dosing 2 weeks ago for hyperkinesis which was thought to be side effect of recent increase in afternoon sinemet dosing.  His sinemet dosing was reduced, but he experienced motor slowing and freezing episodes.  His sinemet dose was adjusted to 7x daily (as noted below) with improvement in his symptoms.  Of note, pt also had UTI sx with urine cx with pseudomonas.  He was discharged with bactrim, attempted to call him to change this, but he did not answer (left message on one of his numbers).  Hospital Course:  Parkinson's with hypokinetic movement Neurology recs appreciated - thought due to decreased sinemet dosing.  Dose increased to regimen listed below during hospitalization with improvement in symptoms. Pt may consider duopa pump as outpatient when he meets again with Dr. Brett Fairy Continue with new dosing regimen of Sinemet at 7x/day (changed from 5x/day) -6AM Sinemet IR 25/100 one tab -8AM Sinemet IR 25/100 one tab -10PM Sinemet IR 25/100 one tab -12PM Sinemet IR 25/100 one tab -2PM Sinemet IR 25/100 one tab -4 PMSinemet IR  25/100 one tab -7 PMSinemet IR 25/100 one tab  Pseudomonas UTI: discharged on bactrim before culture results came back.  Attempted to call patient, but no answer on phone.  Left message.  Sensitivities still pending.  Antibiotic will need to be  adjusted.  Procedures:  none  Consultations:  neurology  Discharge Exam: Vitals:   12/07/17 0359 12/07/17 0825  BP: 119/80 101/64  Pulse: 63 81  Resp: 18 18  Temp: 97.7 F (36.5 C) 97.8 F (36.6 C)  SpO2: 95% 97%   Feeling better.  Notes UTI symptoms for the past week or so.  General: No acute distress. Cardiovascular: Heart sounds show a regular rate, and rhythm. No gallops or rubs. No murmurs. No JVD. Lungs: Clear to auscultation bilaterally with good air movement. No rales, rhonchi or wheezes. Abdomen: Soft, nontender, nondistended with normal active bowel sounds. No masses. No hepatosplenomegaly. Neurological: Alert and oriented 3. Moves all extremities 4. Cranial nerves II through XII grossly intact. Skin: Warm and dry. No rashes or lesions. Extremities: No clubbing or cyanosis. No edema. Pedal pulses 2+. Psychiatric: Mood and affect are normal. Insight and judgment are appropirate.   Discharge Instructions   Discharge Instructions    Call MD for:  difficulty breathing, headache or visual disturbances   Complete by:  As directed    Call MD for:  extreme fatigue   Complete by:  As directed    Call MD for:  hives   Complete by:  As directed    Call MD for:  persistant dizziness or light-headedness   Complete by:  As directed    Call MD for:  persistant nausea and vomiting   Complete by:  As directed    Call MD for:  redness, tenderness, or signs of infection (pain, swelling, redness, odor or green/yellow discharge around incision site)   Complete by:  As directed    Call MD for:  severe uncontrolled pain   Complete by:  As directed    Call MD for:  temperature >100.4   Complete by:  As directed    Diet - low sodium heart healthy   Complete by:  As directed    Discharge instructions   Complete by:  As directed    You were seen for problems with your parkinson's.   This got better with changing your medications.  Please follow up with Dr. Brett Fairy as an  outpatient (call for a sooner appointment).  Take the new regimen as described below. 6AM Sinemet IR 25/100 one tab 8AM Sinemet IR 25/100 one tab 10AM Sinemet IR 25/100 one tab 12PM Sinemet IR 25/100 one tab 2PM Sinemet IR 25/100 one tab 4 PMSinemet IR 25/100 one tab 7 PMSinemet IR 25/100 one tab  You also had a urinary tract infection.  I'm discharging you with bactrim.  Your PCP should follow up the final culture results and this antibiotic may need to be changed as an outpatient.   Please schedule follow up with your PCP in a few days.  Return if you have new, recurrent, or worsening symptoms.  Please ask your PCP to request records from this hospitalization so they know what was done and what the next steps will be.   Increase activity slowly   Complete by:  As directed      Allergies as of 12/07/2017      Reactions   Demerol [meperidine] Hives      Medication List    TAKE these medications   busPIRone 15  MG tablet Commonly known as:  BUSPAR Take 7.5 mg by mouth at bedtime.   carbidopa-levodopa 25-100 MG tablet Commonly known as:  SINEMET IR Take 7 times daily.  Take before breakfast at 6 AM, 8 AM, 10 AM, 12 PM, 2 PM, 4 PM and 7 PM.  Keep at least 30 minutes distance to the next meal, take 1 glass of water with each medication. What changed:  additional instructions Notes to patient:  Please follow instructions   Melatonin 3 MG Tabs Take 3 mg by mouth at bedtime.   pramipexole 0.25 MG tablet Commonly known as:  MIRAPEX TAKE ONE TABLET BY MOUTH THREE TIMES A DAY   selegiline 5 MG tablet Commonly known as:  ELDEPRYL Take 5 mg by mouth 2 (two) times daily. 0700 & 1600   sulfamethoxazole-trimethoprim 800-160 MG tablet Commonly known as:  BACTRIM DS,SEPTRA DS Take 1 tablet by mouth 2 (two) times daily for 7 days.      Allergies  Allergen Reactions  . Demerol [Meperidine] Hives   Follow-up Information    Lawerance Cruel, MD Follow up.   Specialty:   Family Medicine Why:  Please call for follow up appointment Contact information: [place] [address] [phone]        Dohmeier, Asencion Partridge, MD Follow up.   Specialty:  Neurology Why:  Call for follow up appointment Contact information: [address] [place] [address] [phone]            The results of significant diagnostics from this hospitalization (including imaging, microbiology, ancillary and laboratory) are listed below for reference.    Significant Diagnostic Studies: Ct Head Wo Contrast  Result Date: 12/06/2017 CLINICAL DATA:  Left-sided weakness.  History of Parkinson's disease EXAM: CT HEAD WITHOUT CONTRAST TECHNIQUE: Contiguous axial images were obtained from the base of the skull through the vertex without intravenous contrast. COMPARISON:  Head CT March 09, 2007; brain MRI March 09, 2007 FINDINGS: Brain: The ventricles are normal in size and configuration. There is mild sulcal and cerebellar region atrophy. There is no mass hemorrhage extra-axial fluid collection, or midline shift. There is decreased attenuation throughout much of the dentate nucleus of the right cerebellum, a finding not present previously and consistent with potential recent/acute infarct involving this portion of the cerebellum. There is no effacement of the fourth ventricle. Elsewhere, gray-white compartments appear unremarkable. Basal ganglia calcification is felt to be physiologic and is stable. Vascular: No hyperdense vessels are evident. There are foci of calcification in each carotid siphon region. Skull: Bony calvarium appears intact. Sinuses/Orbits: There is mucosal thickening in the maxillary antra bilaterally. There is opacification in multiple ethmoid air cells. There is mucosal thickening in the anterior aspect of each sphenoid sinus as well as in the frontal sinus regions bilaterally. Orbits appear symmetric bilaterally. Other: Patient has had  previous mastoidectomy on the right. Mastoids on the left which are visualized are clear. IMPRESSION: 1. Decreased attenuation in the dentate nucleus on the right compared to the left, a finding not present previously and concerning for potential recent/acute infarct. No other findings concerning for potential acute infarct. No demonstrable mass or hemorrhage. Basal ganglia calcification is felt be physiologic. 2. There is a degree of sulcal in cerebellar atrophy. Ventricles appear normal in size and configuration. 3.  There are foci of arterial vascular calcification. 4. Multifocal paranasal sinus disease, most severe in the ethmoid air cell regions. 5.  Previous mastoidectomy on the right. Electronically Signed  By: Lowella Grip III M.D.   On: 12/06/2017 07:53   Mr Jeri Cos And Wo Contrast  Result Date: 12/06/2017 CLINICAL DATA:  72 year old male with left side weakness. Altered level of consciousness. EXAM: MRI HEAD WITHOUT AND WITH CONTRAST TECHNIQUE: Multiplanar, multiecho pulse sequences of the brain and surrounding structures were obtained without and with intravenous contrast. CONTRAST:  15 milliliters MultiHance. COMPARISON:  Head CT without contrast 0745 hours today. Brain MRI 03/09/2007. FINDINGS: Brain: No restricted diffusion to suggest acute infarction. No midline shift, mass effect, evidence of mass lesion, ventriculomegaly, extra-axial collection or acute intracranial hemorrhage. Cervicomedullary junction and pituitary are within normal limits. Cerebral volume has not significantly changed since 2008 and remains within normal limits for age. Pearline Cables and white matter signal is within normal limits for age throughout the brain. No cortical or deep gray matter encephalomalacia identified. No chronic cerebral blood products. The brainstem and cerebellum appear normal for age. No abnormal enhancement identified. No dural thickening. Vascular: Major intracranial vascular flow voids are stable since  2008 aside from increased chronic tortuosity of the cavernous ICA segments. The major dural venous sinuses are enhancing and appear patent. Skull and upper cervical spine: Degenerative appearing ligamentous hypertrophy about the odontoid has progressed since 2008. Otherwise negative visible upper cervical spine. Normal bone marrow signal. Sinuses/Orbits: Postoperative changes to both globes. Otherwise normal orbits soft tissues. Bilateral paranasal sinus and nasal cavity mucosal thickening and enhancement. No sinus fluid levels or bubbly opacity. Questionable increased nasopharyngeal mucosal space thickening and enhancement also. Trace retained secretions in the nasopharynx. Other: The mastoid air cells remain clear. Visible internal auditory structures appear normal. Scalp and face soft tissues appear negative. IMPRESSION: 1. Normal for age MRI appearance of the brain. 2. Generalized sinonasal mucosal thickening and hyperenhancement, and questionable associated similar nasopharynx mucosal inflammation. Consider acute URI. Electronically Signed   By: Genevie Ann M.D.   On: 12/06/2017 12:46    Microbiology: Recent Results (from the past 240 hour(s))  Urine Culture     Status: Abnormal (Preliminary result)   Collection Time: 12/06/17  8:52 AM  Result Value Ref Range Status   Specimen Description URINE, CLEAN CATCH  Final   Special Requests   Final    NONE Performed at [facility], [place] [address], [place], [place] [address]    Culture >=100,000 COLONIES/mL PSEUDOMONAS AERUGINOSA (A)  Final   Report Status PENDING  Incomplete     Labs: Basic Metabolic Panel: Recent Labs  Lab 12/06/17 0709 12/07/17 0319  NA 142 139  K 4.1 4.1  CL 107 107  CO2 27 24  GLUCOSE 104* 96  BUN 14 9  CREATININE 0.86 0.77  CALCIUM 9.4 9.7  MG 1.9  --    Liver Function Tests: Recent Labs  Lab 12/06/17 0709  AST 20  ALT 5*  ALKPHOS 61  BILITOT 0.6  PROT 6.3*  ALBUMIN 3.8   No results for input(s):  LIPASE, AMYLASE in the last 168 hours. No results for input(s): AMMONIA in the last 168 hours. CBC: Recent Labs  Lab 12/06/17 0709 12/07/17 0319  WBC 6.3 8.9  NEUTROABS 3.1  --   HGB 13.5 15.2  HCT 43.2 46.9  MCV 90.0 89.5  PLT 224 251   Cardiac Enzymes: Recent Labs  Lab 12/06/17 0709  TROPONINI <0.03   BNP: BNP (last 3 results) No results for input(s): BNP in the last 8760 hours.  ProBNP (last 3 results) No results for input(s): PROBNP in the  last 8760 hours.  CBG: No results for input(s): GLUCAP in the last 168 hours.     Signed:  Fayrene Helper MD.  [facility] 12/07/2017, 8:00 PM

## 2017-12-07 NOTE — Telephone Encounter (Signed)
Pt's wife called to schedule f/u s/p hospital visit.  They are flying out of town on 6/17 and he needs to be cleared to fly. There is not an available opening. Please call to advise after 3:30, she has some errands to and asked  if don't get an answer please call back in one hour.

## 2017-12-07 NOTE — Evaluation (Signed)
Occupational Therapy Evaluation Patient Details Name: Tim [name] MRN: [id number] DOB: Nov 12, 1945 Today's Date: 12/07/2017    History of Present Illness 72 y.o. male with a PMH of Parkinsonism; BPH; and HLD who presented with hypokinesis. He is significantly improved today with the increased dosing frequency of Sinemet prescribed yesterday.   Clinical Impression   Pt admitted with the above diagnoses. PTA pt was independent with ADLs, works part-time in office setting. Pt completed dynamic grooming task in standing, household distance functional mobility, and fucntional transfers. Stairs incorporated into session. Pt reports and observed to be back at baseline with ADLs. No further OT needs indicated. Pt reports he is to d/c home today. OT signing off.       Follow Up Recommendations  No OT follow up    Equipment Recommendations  None recommended by OT    Recommendations for Other Services       Precautions / Restrictions Restrictions Weight Bearing Restrictions: No      Mobility Bed Mobility               General bed mobility comments: up in chair  Transfers Overall transfer level: Independent Equipment used: None                  Balance Overall balance assessment: No apparent balance deficits (not formally assessed)                                         ADL either performed or assessed with clinical judgement   ADL Overall ADL's : At baseline;Independent                                       General ADL Comments: Pt completed grooming task in standing, household distance functional mobility, and fucntional transfers. Stairs incorporated into session. Pt reports and observed to be back at baseline with ADLs.      Vision Baseline Vision/History: Wears glasses       Perception     Praxis      Pertinent Vitals/Pain Pain Assessment: Faces Faces Pain Scale: No hurt     Hand Dominance     Extremity/Trunk  Assessment Upper Extremity Assessment Upper Extremity Assessment: Overall WFL for tasks assessed   Lower Extremity Assessment Lower Extremity Assessment: Overall WFL for tasks assessed       Communication Communication Communication: No difficulties   Cognition Arousal/Alertness: Awake/alert Behavior During Therapy: WFL for tasks assessed/performed Overall Cognitive Status: Within Functional Limits for tasks assessed                                     General Comments  spouse present    Exercises     Shoulder Instructions      Home Living Family/patient expects to be discharged to:: Private residence Living Arrangements: Spouse/significant other Available Help at Discharge: Family Type of Home: House Home Access: Stairs to enter Technical brewer of Steps: 3 Entrance Stairs-Rails: Right Home Layout: Two level;Able to live on main level with bedroom/bathroom Alternate Level Stairs-Number of Steps: full flight Alternate Level Stairs-Rails: Right Bathroom Shower/Tub: Occupational psychologist: Standard     Home Equipment: Environmental consultant - 2 wheels  Prior Functioning/Environment Level of Independence: Independent        Comments: work part-time in afternoon, Insurance risk surveyor.        OT Problem List:        OT Treatment/Interventions:      OT Goals(Current goals can be found in the care plan section) Acute Rehab OT Goals Patient Stated Goal: home today  OT Frequency:     Barriers to D/C:            Co-evaluation              AM-PAC PT "6 Clicks" Daily Activity     Outcome Measure Help from another person eating meals?: None Help from another person taking care of personal grooming?: None Help from another person toileting, which includes using toliet, bedpan, or urinal?: None Help from another person bathing (including washing, rinsing, drying)?: None Help from another person to put on and taking off regular upper  body clothing?: None Help from another person to put on and taking off regular lower body clothing?: None 6 Click Score: 24   End of Session Nurse Communication: Mobility status  Activity Tolerance: Patient tolerated treatment well Patient left: in chair;with call bell/phone within reach;with family/visitor present  OT Visit Diagnosis: Other abnormalities of gait and mobility (R26.89)                Time: 8413-2440 OT Time Calculation (min): 11 min Charges:  OT General Charges $OT Visit: 1 Visit OT Evaluation $OT Eval Low Complexity: 1 Low G-Codes:       Hortencia Pilar 12/07/2017, 9:38 AM

## 2017-12-08 LAB — URINE CULTURE

## 2017-12-14 ENCOUNTER — Telehealth: Payer: Self-pay | Admitting: Neurology

## 2017-12-14 DIAGNOSIS — N390 Urinary tract infection, site not specified: Secondary | ICD-10-CM | POA: Diagnosis not present

## 2017-12-14 DIAGNOSIS — Z09 Encounter for follow-up examination after completed treatment for conditions other than malignant neoplasm: Secondary | ICD-10-CM | POA: Diagnosis not present

## 2017-12-14 NOTE — Telephone Encounter (Signed)
This left handed male PD Patient reports freezing and end of dose phenomenon while taking carbi-levodopa 5 times daily. Has hyperkinesis under the same drug taken in extended release form. The implanted pump DuoDopa is usually used for this constellation of symptoms- and this procedure was approached by Dr Deboraha Sprang in his last visit in April 2019 -   Here quoted : Current PD related symptoms include: Wearing off. Difficulty staying asleep. He also reports some pulling of left shoulder and I wonder if this is dyskinesia. Also reports anxiety and depression at times but that fluctuates and I wonder if that is a dopamine deficiency state at that time  DBS and Duopa candidacy: I have discussed DBS and Duopa with both of them in detail to address the wearing off symptoms. He is not interested in DBS. He is fully aware that there is a window of approximately for DBS and if he does not go for DBS during that time, that treatment option would not be there. At present he may be candidate for DBS after evaluating from a neuropsych standpoint. He would be a candidate for Duopa in future as well. Wife's main concern is that he takes a very long time to recover from any type of his surgery. He is left-handed and if he decides to go for DBS, right STN might be an option. I have shared with him NPF brochure on DBS and discussed early stim trial on DBS as well. Main symptoms expected to improve would be bilateral hand tremors, bradykinesia. Speech is not expected to improve  Updated 10/25/2017 I have discussed again dbs and Duopa. They are sure that they do not want to go for DBS His main problems are, motor fluctuations in the form of moderately severe dyskinesias as well as off times. All of this visit was spent discussing treatment options and answering questions. I discussed Duopa in detail again, also gave them educational material. Wife is adamant that he would not be able to tolerate this procedure. They do not want to  pursue it because of this reason as well as when they moved to [facility], their concern is that there would not be a specialized center for this. I told him that if the pump is not working, he can always go back to his pre-pump oral Parkinson medication dosage. Patient was more interested but wife seems to be quite frustrated with his illness, she expressed it many times  and wife has concerns that he might not be able to withstand Duopa procedure. I have given him educational material and answered questions. Target symptom would be to improve motor fluctuations in the form of moderately severe dyskinesias as well as off times

## 2017-12-15 ENCOUNTER — Ambulatory Visit (INDEPENDENT_AMBULATORY_CARE_PROVIDER_SITE_OTHER): Payer: Medicare Other | Admitting: Neurology

## 2017-12-15 ENCOUNTER — Encounter: Payer: Self-pay | Admitting: Neurology

## 2017-12-15 VITALS — BP 102/63 | HR 90 | Ht 67.0 in | Wt 144.0 lb

## 2017-12-15 DIAGNOSIS — F909 Attention-deficit hyperactivity disorder, unspecified type: Secondary | ICD-10-CM | POA: Diagnosis not present

## 2017-12-15 DIAGNOSIS — G20 Parkinson's disease: Secondary | ICD-10-CM

## 2017-12-15 MED ORDER — CARBIDOPA-LEVODOPA ER 25-100 MG PO TBCR: 1.0000 | EXTENDED_RELEASE_TABLET | Freq: Every evening | ORAL | 5 refills | Status: DC | PRN

## 2017-12-15 MED ORDER — CARBIDOPA-LEVODOPA 25-100 MG PO TABS: ORAL_TABLET | ORAL | 3 refills | Status: DC
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
## 2018-01-30 ENCOUNTER — Ambulatory Visit: Payer: Medicare Other | Admitting: Neurology

## 2018-02-28 ENCOUNTER — Ambulatory Visit: Payer: Medicare Other | Admitting: Neurology

## 2018-03-15 ENCOUNTER — Other Ambulatory Visit: Payer: Self-pay | Admitting: Neurology

## 2018-03-21 ENCOUNTER — Telehealth: Payer: Self-pay | Admitting: Neurology

## 2018-03-21 NOTE — Telephone Encounter (Signed)
Estill Bamberg with [facility] requesting a call back at [phone] to discuss pts dosing for pramipexole (MIRAPEX) 0.25 MG tablet

## 2018-03-22 ENCOUNTER — Other Ambulatory Visit: Payer: Self-pay | Admitting: Neurology

## 2018-03-22 MED ORDER — PRAMIPEXOLE DIHYDROCHLORIDE 0.25 MG PO TABS: 0.2500 mg | ORAL_TABLET | Freq: Three times a day (TID) | ORAL | 1 refills | Status: DC

## 2018-03-22 NOTE — Telephone Encounter (Signed)
I have corrected the number total to allow the patient a full 3 mth supply. Patient should take 1 tablet three times a day.

## 2018-04-16 ENCOUNTER — Ambulatory Visit (INDEPENDENT_AMBULATORY_CARE_PROVIDER_SITE_OTHER): Payer: Medicare Other | Admitting: Neurology

## 2018-04-16 ENCOUNTER — Encounter: Payer: Self-pay | Admitting: Neurology

## 2018-04-16 VITALS — BP 121/68 | HR 64 | Ht 67.0 in | Wt 134.0 lb

## 2018-04-16 DIAGNOSIS — F909 Attention-deficit hyperactivity disorder, unspecified type: Secondary | ICD-10-CM

## 2018-04-16 DIAGNOSIS — R6889 Other general symptoms and signs: Secondary | ICD-10-CM

## 2018-04-16 DIAGNOSIS — Z5181 Encounter for therapeutic drug level monitoring: Secondary | ICD-10-CM | POA: Diagnosis not present

## 2018-04-16 DIAGNOSIS — G20 Parkinson's disease: Secondary | ICD-10-CM | POA: Diagnosis not present

## 2018-04-16 MED ORDER — CARBIDOPA-LEVODOPA 25-100 MG PO TABS: ORAL_TABLET | ORAL | 9 refills | Status: DC

## 2018-04-16 MED ORDER — LEVODOPA 42 MG IN CAPS: 2.0000 | ORAL_CAPSULE | Freq: Two times a day (BID) | RESPIRATORY_TRACT | 5 refills | Status: DC | PRN

## 2018-04-16 NOTE — Addendum Note (Signed)
Addended by: Larey Seat on: 04/16/2018 11:47 AM   Modules accepted: Orders
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
## 2018-04-16 NOTE — Patient Instructions (Signed)
inbrija-  This is a sinemet ( Dopamin)  Inhaler with a very short time to act, helping with freezing.  I will ask Dr. Harrington Challenger to check you out for Gi absorption problems or Gastroparesis. This can explain some of the weight loss issues, but also the unpredictable response to oral sinemet.

## 2018-04-20 DIAGNOSIS — Z23 Encounter for immunization: Secondary | ICD-10-CM | POA: Diagnosis not present

## 2018-04-20 DIAGNOSIS — R634 Abnormal weight loss: Secondary | ICD-10-CM | POA: Diagnosis not present

## 2018-04-20 DIAGNOSIS — E46 Unspecified protein-calorie malnutrition: Secondary | ICD-10-CM | POA: Diagnosis not present

## 2018-04-23 ENCOUNTER — Other Ambulatory Visit: Payer: Self-pay | Admitting: Family Medicine

## 2018-04-23 DIAGNOSIS — R634 Abnormal weight loss: Secondary | ICD-10-CM

## 2018-04-24 ENCOUNTER — Telehealth: Payer: Self-pay | Admitting: Neurology

## 2018-04-24 NOTE — Telephone Encounter (Signed)
Completed and submitted for PA through cover my meds/ [facility] scripts for inbrija. HRC:[id number] Can take up to 24 hrs to hear response.

## 2018-04-24 NOTE — Telephone Encounter (Signed)
PA approved through [employer] for the patient and covered until 07/03/2018. Referral # is [id number]

## 2018-04-26 ENCOUNTER — Telehealth: Payer: Self-pay | Admitting: *Deleted

## 2018-04-26 NOTE — Telephone Encounter (Signed)
Received fax that the [facility] does carry this drug and needs to go to specialty pharmacy,  I completed inbrija form, to Dr. Brett Fairy for review and signature.

## 2018-04-26 NOTE — Telephone Encounter (Signed)
I called [facility] Prescription support services, signature not needed but to relay to pt that will call.  I spoke to wife, who did not want pt placed on another medication.  She took phone number to call them.  I relayed it is a specialty drug and will come in mail.  (not a oral capsule, but used with inhaler).  She will discuss with pt, her husband, and get back to [place].  Inbrija on hold.  I faxed [facility] [address] that it is true not filled at local pharmacy, but specialty pharm.

## 2018-04-30 ENCOUNTER — Ambulatory Visit
Admission: RE | Admit: 2018-04-30 | Discharge: 2018-04-30 | Disposition: A | Discharge status: Home / Self Care | Payer: Medicare Other | Source: Ambulatory Visit | Attending: Family Medicine | Admitting: Family Medicine

## 2018-04-30 DIAGNOSIS — R634 Abnormal weight loss: Secondary | ICD-10-CM

## 2018-04-30 MED ORDER — IOPAMIDOL (ISOVUE-300) INJECTION 61%
100.0000 mL | Freq: Once | INTRAVENOUS | Status: AC | PRN
  Administered 2018-04-30: 100 mL via INTRAVENOUS

## 2018-04-30 NOTE — Telephone Encounter (Signed)
We got this medication covered and he was interested while here. WE NEED HIM TO TRY before he feels he has no options left. This is an important new way of application, the inhaler- and I was able to demonstrate some of it while he was here.  I am very concerned that Tim [name] was not the decision maker in that phone conversation, may have not been present while [facility] called. I would prefer a nurse visit either meet him here or at home .

## 2018-05-01 NOTE — Telephone Encounter (Signed)
Called the patient's cell phone, no answer. Mailbox is full unable to LVM.

## 2018-05-01 NOTE — Telephone Encounter (Signed)
Patient called back. I was able to talk with him and inform him that I was out of the office last week so wanted to follow up on the medication inbrija with him. Wanted to inform him that I had already completed the prior authorization for the medication in which he was approved. The patient asked me what the next step was. I informed him that basically the next step would be the specialty pharmacy would be in contact with him, advised there are pt assistance programs through their company and also encouraged taking the time to set up an apt with a nurse through the pharmacy who could discuss the medication in more detail and answer any questions they may have in regards to this medication. While I was talking I could hear the wife in the background talking to Tim [name] informing him that we should not be having this conversation right now as it was rude and he was in airport and this was not the appropriate time to be discussing this. She then proceeded to take the patient's mobile phone from me and advised me that we could not discuss this further right now as they are in the airport and will be leaving for a week and a half and they will call us back. I informed her that he called me back, and she stated it was because they didn't know who the number was but at this time it is not the time to talk about this.

## 2018-05-28 ENCOUNTER — Emergency Department (HOSPITAL_COMMUNITY): Payer: Medicare Other

## 2018-05-28 ENCOUNTER — Other Ambulatory Visit: Payer: Self-pay

## 2018-05-28 ENCOUNTER — Encounter (HOSPITAL_COMMUNITY): Payer: Self-pay | Admitting: Emergency Medicine

## 2018-05-28 DIAGNOSIS — Z79899 Other long term (current) drug therapy: Secondary | ICD-10-CM | POA: Diagnosis not present

## 2018-05-28 DIAGNOSIS — Y9389 Activity, other specified: Secondary | ICD-10-CM | POA: Insufficient documentation

## 2018-05-28 DIAGNOSIS — W01198A Fall on same level from slipping, tripping and stumbling with subsequent striking against other object, initial encounter: Secondary | ICD-10-CM | POA: Insufficient documentation

## 2018-05-28 DIAGNOSIS — Y9289 Other specified places as the place of occurrence of the external cause: Secondary | ICD-10-CM | POA: Diagnosis not present

## 2018-05-28 DIAGNOSIS — S0003XA Contusion of scalp, initial encounter: Secondary | ICD-10-CM | POA: Insufficient documentation

## 2018-05-28 DIAGNOSIS — Y990 Civilian activity done for income or pay: Secondary | ICD-10-CM | POA: Insufficient documentation

## 2018-05-28 DIAGNOSIS — G20 Parkinson's disease: Secondary | ICD-10-CM | POA: Diagnosis not present

## 2018-05-28 DIAGNOSIS — S0990XA Unspecified injury of head, initial encounter: Secondary | ICD-10-CM | POA: Diagnosis not present

## 2018-05-28 NOTE — ED Triage Notes (Signed)
Pt arriving POV following a fall at his office. Pt states he stepped back and his foot hit the bottom corner of a refrigerator, pt then fell backwards and hit his head. Pt denies LOC or any vision changes.

## 2018-05-29 ENCOUNTER — Emergency Department (HOSPITAL_COMMUNITY)
Admission: EM | Admit: 2018-05-29 | Discharge: 2018-05-29 | Disposition: A | Discharge status: Home / Self Care | Payer: Medicare Other | Attending: Emergency Medicine | Admitting: Emergency Medicine

## 2018-05-29 DIAGNOSIS — S0003XA Contusion of scalp, initial encounter: Secondary | ICD-10-CM | POA: Diagnosis not present

## 2018-05-29 DIAGNOSIS — H6983 Other specified disorders of Eustachian tube, bilateral: Secondary | ICD-10-CM | POA: Diagnosis not present

## 2018-05-29 DIAGNOSIS — S0990XA Unspecified injury of head, initial encounter: Secondary | ICD-10-CM

## 2018-05-29 DIAGNOSIS — G20 Parkinson's disease: Secondary | ICD-10-CM | POA: Diagnosis not present

## 2018-05-29 DIAGNOSIS — H903 Sensorineural hearing loss, bilateral: Secondary | ICD-10-CM | POA: Diagnosis not present

## 2018-05-29 DIAGNOSIS — W19XXXA Unspecified fall, initial encounter: Secondary | ICD-10-CM

## 2018-05-29 MED ORDER — HYDROGEN PEROXIDE 3 % EX SOLN
CUTANEOUS | Status: AC
  Administered 2018-05-29: 01:00:00
  Filled 2018-05-29: qty 473
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
## 2018-05-29 NOTE — Discharge Instructions (Addendum)
You were seen today after a fall.  Your CT scan is negative.  You have a slight abrasion over the back of your head.  This will heal well on its own.

## 2018-06-14 ENCOUNTER — Ambulatory Visit: Payer: Medicare Other

## 2018-06-14 ENCOUNTER — Ambulatory Visit: Payer: Medicare Other | Admitting: Physical Therapy

## 2018-06-14 ENCOUNTER — Ambulatory Visit: Payer: Medicare Other | Attending: Family Medicine | Admitting: Occupational Therapy

## 2018-06-14 DIAGNOSIS — R471 Dysarthria and anarthria: Secondary | ICD-10-CM | POA: Insufficient documentation

## 2018-06-14 DIAGNOSIS — R2689 Other abnormalities of gait and mobility: Secondary | ICD-10-CM | POA: Insufficient documentation

## 2018-06-14 NOTE — Therapy (Signed)
[facility] [address] [place], [place], [address] Phone: [phone]   Fax:  [phone]  Patient Details  Name: Tim [name] MRN: [id number] Date of Birth: [birth year] Referring Provider:  Larey Seat, MD  Encounter Date: 06/14/2018  Speech Therapy Parkinson's Disease Screen   Decibel Level today: mid 60sdB  (WNL=70-72 dB) with sound level meter 30cm away from pt's mouth. Pt's conversational volume is lower than normal and pt/wife both say people ask pt to repeat himself moreso now than ever before.  And wife deny any overt difficulties in swallowing to date.  Pt would benefit from speech-language eval for dysarthria- please order via EPIC or call [phone] to schedule   [facility] ,[place], CCC-SLP  06/14/2018, 8:25 AM  [facility] [address] [place], [place], [address] Phone: [phone]   Fax:  [phone]

## 2018-06-14 NOTE — Therapy (Signed)
[facility] [address] [place], [place], [address] Phone: [phone]   Fax:  [phone]  Patient Details  Name: Tim [name] MRN: [id number] Date of Birth: [birth year] Referring Provider:  Lawerance Cruel, MD  Encounter Date: 06/14/2018  Physical Therapy Parkinson's Disease Screen   Timed Up and Go test:13.72 sec  10 meter walk test:9.96 sec (3.29 ft/sec)  5 time sit to stand test:14.25 sec  Patient would benefit from Physical Therapy evaluation due to  Slowed mobility, recent fall.     Mady Haagensen, PT 06/14/18 8:30 AM Phone: [phone] Fax: [phone]   Frazier Butt. 06/14/2018, 8:26 AM  [facility] [address] [place], [place], [address] Phone: [phone]   Fax:  [phone]

## 2018-06-16 ENCOUNTER — Other Ambulatory Visit: Payer: Self-pay | Admitting: Neurology

## 2018-06-19 ENCOUNTER — Other Ambulatory Visit: Payer: Self-pay | Admitting: Neurology

## 2018-06-20 ENCOUNTER — Other Ambulatory Visit: Payer: Self-pay | Admitting: Neurology

## 2018-06-28 ENCOUNTER — Telehealth: Payer: Self-pay | Admitting: Neurology

## 2018-06-28 NOTE — Telephone Encounter (Signed)
Frazier Butt, PT  Aaronmichael Brumbaugh, Asencion Partridge, MD        Dr. Brett Fairy,  Tim [name] was seen for PD screens this morning. He would benefit from physical therapy due to recent falls, slowed mobility, difficulty with off-times of medications.He would benefit from occupational therapy due to reports of slowed performance with ADLS and coordination. He would benefit from speech therapy for voice volume.   If you agree, please send orders via Epic. Thank you.   Mady Haagensen, PT    Please let Megan sign these orders. CD

## 2018-06-29 ENCOUNTER — Other Ambulatory Visit: Payer: Self-pay | Admitting: Neurology

## 2018-06-29 DIAGNOSIS — G20 Parkinson's disease: Secondary | ICD-10-CM

## 2018-06-29 NOTE — Telephone Encounter (Signed)
Order placed and sent to Ward Givens , NP to sign on behalf of Dr Brett Fairy.

## 2018-07-18 DIAGNOSIS — G20 Parkinson's disease: Secondary | ICD-10-CM | POA: Diagnosis not present

## 2018-07-18 DIAGNOSIS — R636 Underweight: Secondary | ICD-10-CM | POA: Diagnosis not present

## 2018-07-20 DIAGNOSIS — R351 Nocturia: Secondary | ICD-10-CM | POA: Diagnosis not present

## 2018-07-20 DIAGNOSIS — N3000 Acute cystitis without hematuria: Secondary | ICD-10-CM | POA: Diagnosis not present

## 2018-07-20 DIAGNOSIS — N430 Encysted hydrocele: Secondary | ICD-10-CM | POA: Diagnosis not present

## 2018-07-20 DIAGNOSIS — N401 Enlarged prostate with lower urinary tract symptoms: Secondary | ICD-10-CM | POA: Diagnosis not present

## 2018-08-07 ENCOUNTER — Telehealth: Payer: Self-pay | Admitting: Physical Therapy

## 2018-08-07 ENCOUNTER — Ambulatory Visit: Payer: Medicare Other | Admitting: Occupational Therapy

## 2018-08-07 ENCOUNTER — Ambulatory Visit: Payer: Medicare Other | Admitting: Physical Therapy

## 2018-08-07 ENCOUNTER — Ambulatory Visit: Payer: Medicare Other | Attending: Family Medicine | Admitting: Speech Pathology

## 2018-08-07 NOTE — Telephone Encounter (Signed)
Called and spoke with patient (and wife in the background participating in conversation), as pt did not show for PT, OT, speech evaluations today.  They report not getting a reminder call, but they do agree to calling back to front office to reschedule those evaluations.  Mady Haagensen, PT 08/07/18 12:02 PM Phone: [phone] Fax: [phone]

## 2018-08-26 IMAGING — US US ART/VEN ABD/PELV/SCROTUM DOPPLER LTD
1 series · 13 of 25 positions shown · non-contrast
Comparison: None available.

CLINICAL DATA: Initial evaluation for right larger than left
scrotal mass. History of prior vasectomy.

EXAM:
SCROTAL ULTRASOUND
DOPPLER ULTRASOUND OF THE TESTICLES
TECHNIQUE: Complete ultrasound examination of the testicles, epididymis, and
other scrotal structures was performed. Color and spectral Doppler
ultrasound were also utilized to evaluate blood flow to the
testicles.

[Series 1: us art/ven abd/pelv/scrotum doppler ltd · 0.13mm/px · 13 of 75 slices shown]
[im 1/75]
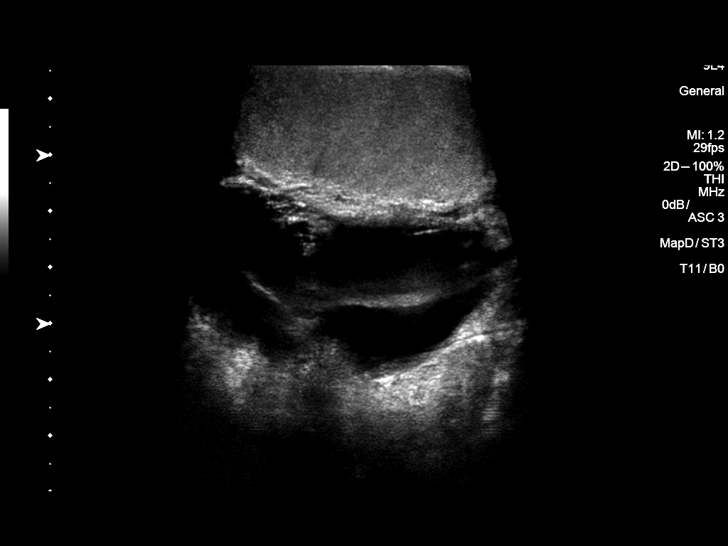
[im 7/75]
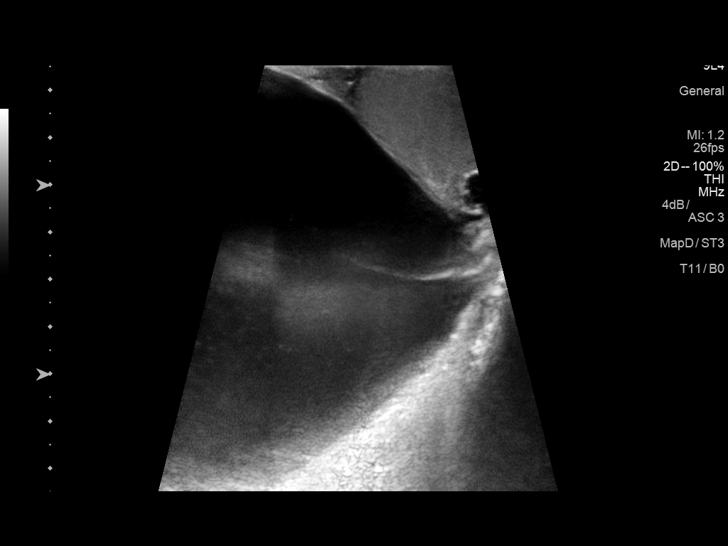
[im 13/75]
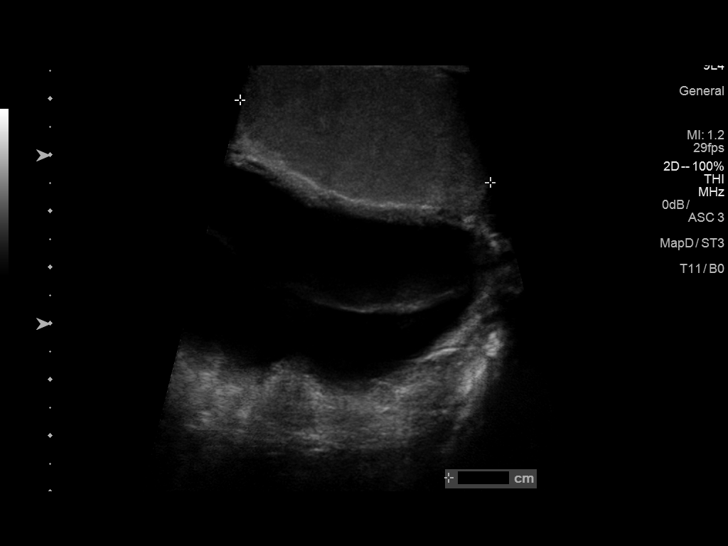
[im 19/75]
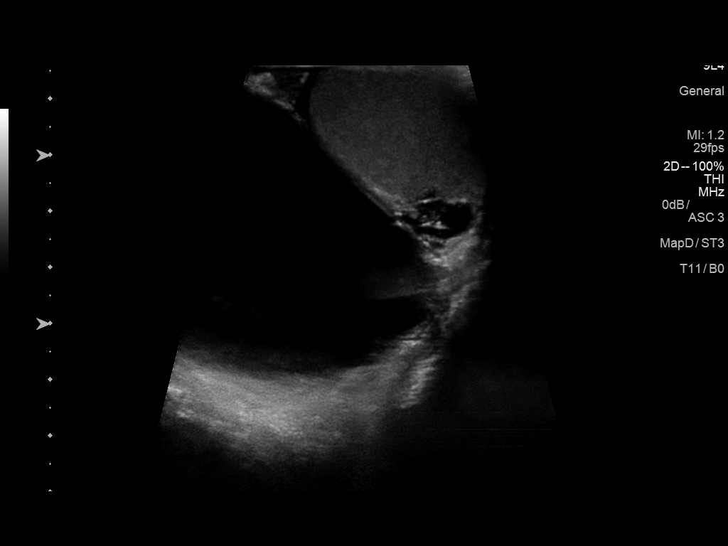
[im 25/75]
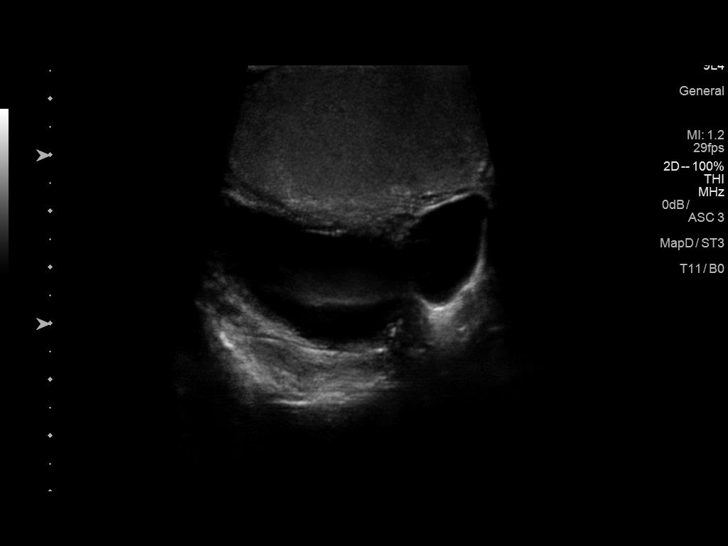
[im 31/75]
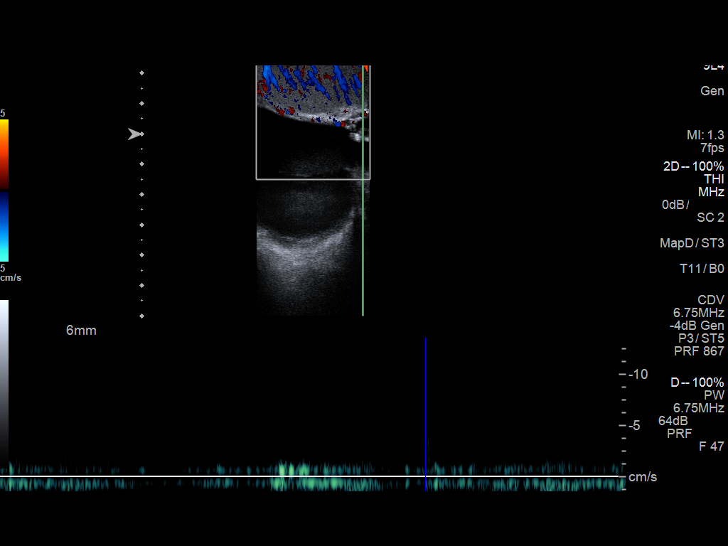
[im 38/75]
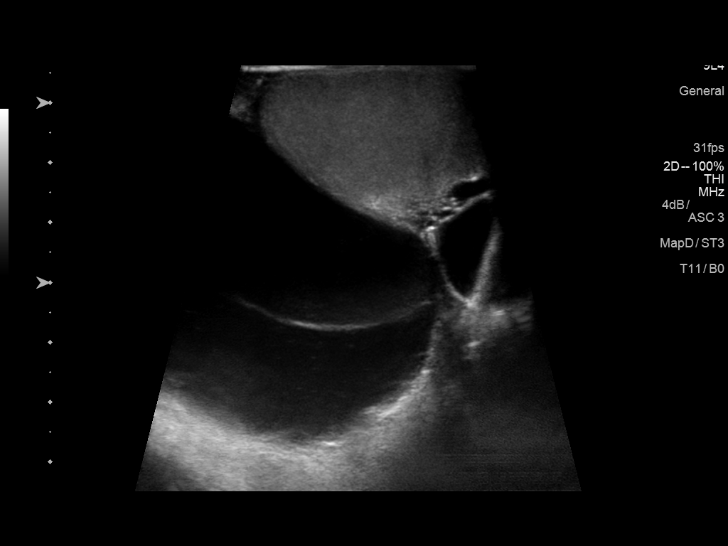
[im 44/75]
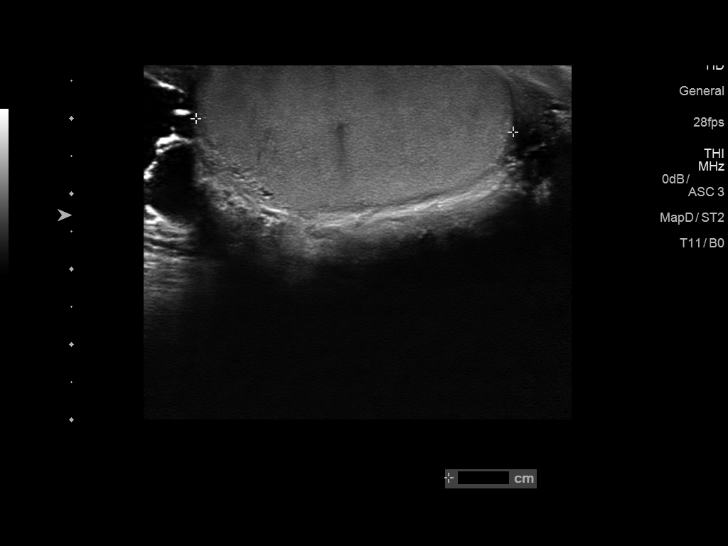
[im 50/75]
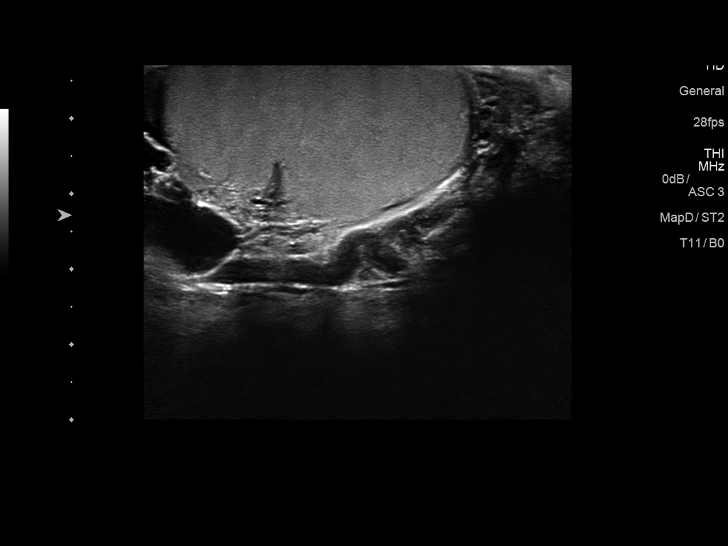
[im 56/75]
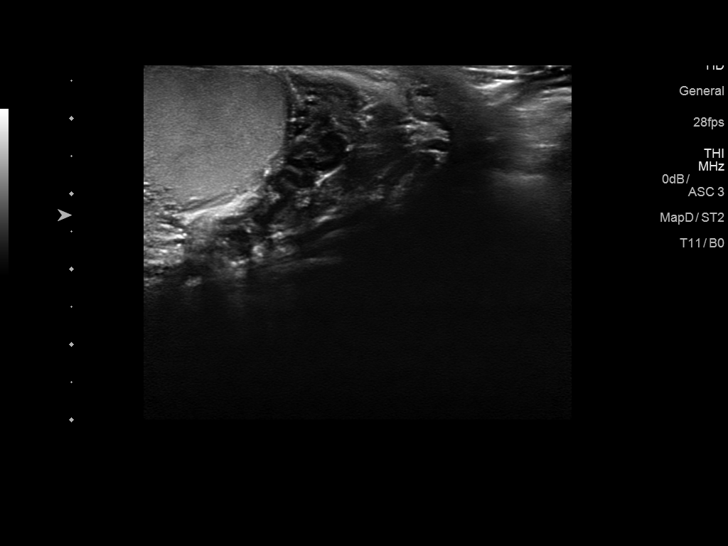
[im 62/75]
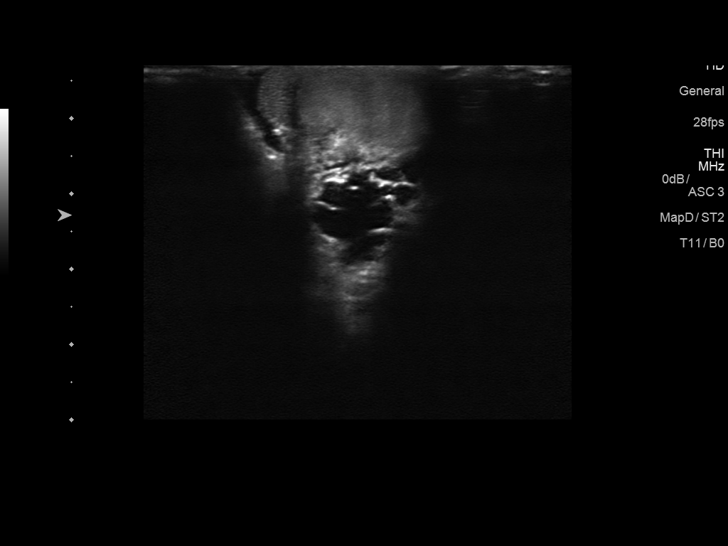
[im 68/75]
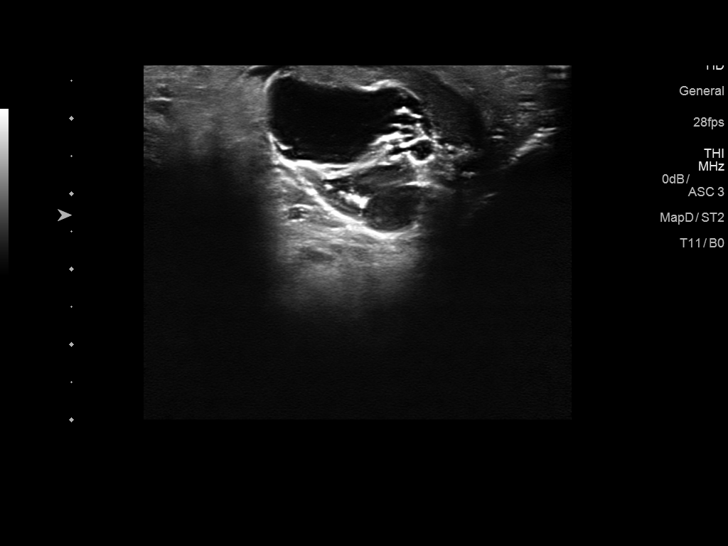
[im 75/75]
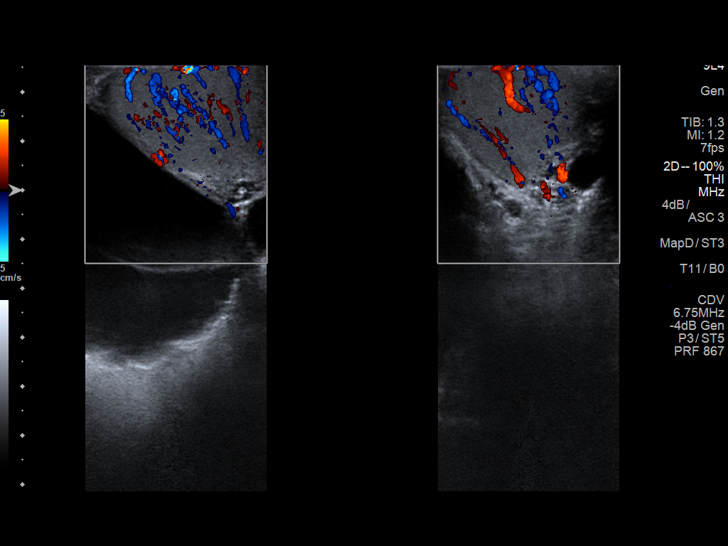

[13 of 25 positions shown; findings below may reference images not displayed]

FINDINGS: Right testicle

Measurements: 4.7 x 2.7 x 3.3 cm. Cystic dilatation of the rete
testes noted. No solid or concerning mass. No microlithiasis.

Left testicle

Measurements: 4.2 x 2.4 x 2.4 cm. Cystic dilatation of the rete
testes noted. No solid or concerning mass. No microlithiasis.

Right epididymis: Large predominantly anechoic mildly complex cystic
lesion measuring 13.2 x 8.4 x 7.0 cm positioned adjacent to the
right testicle possible thin internal septation. Finding favored to
reflect a large complex hydrocele, although a large epididymal cyst
could also conceivably be considered. Remainder of the right
epididymis is grossly unremarkable.

Left epididymis: Complex cyst within the left epididymal head
measured 3.2 x 1.9 x 1.9 cm. This is predominantly cystic in
appearance with fairly benign features.

Hydrocele:  Probable large right hydrocele.

Varicocele:  None visualized.

Pulsed Doppler interrogation of both testes demonstrates normal low
resistance arterial and venous waveforms bilaterally.
IMPRESSION: 1. Large complex cystic lesion adjacent to the right testicle
measuring 13.2 x 8.4 x 7.0 cm, favored to reflect a large complex
right-sided hydrocele. A complex epididymal cyst could conceivably
be considered as well, although is less favored given size of
lesion.
2. 3.2 x 1.9 x 1.9 cm complex left epididymal head cyst.
3. No concerning solid testicular mass. Dilatation of the rete
testes incidentally noted bilaterally, more prominent on the right.

## 2018-09-04 ENCOUNTER — Ambulatory Visit: Payer: Medicare Other | Admitting: Neurology

## 2018-09-07 ENCOUNTER — Other Ambulatory Visit: Payer: Self-pay | Admitting: Neurology

## 2018-09-26 DIAGNOSIS — R351 Nocturia: Secondary | ICD-10-CM | POA: Diagnosis not present

## 2018-09-26 DIAGNOSIS — N401 Enlarged prostate with lower urinary tract symptoms: Secondary | ICD-10-CM | POA: Diagnosis not present

## 2018-09-26 DIAGNOSIS — R3911 Hesitancy of micturition: Secondary | ICD-10-CM | POA: Diagnosis not present

## 2018-09-26 DIAGNOSIS — N430 Encysted hydrocele: Secondary | ICD-10-CM | POA: Diagnosis not present

## 2018-09-26 DIAGNOSIS — R3912 Poor urinary stream: Secondary | ICD-10-CM | POA: Diagnosis not present

## 2018-09-26 DIAGNOSIS — N35912 Unspecified bulbous urethral stricture, male: Secondary | ICD-10-CM | POA: Diagnosis not present

## 2018-10-29 ENCOUNTER — Encounter: Payer: Self-pay | Admitting: Neurology

## 2018-10-29 ENCOUNTER — Telehealth: Payer: Self-pay | Admitting: Neurology

## 2018-10-29 NOTE — Telephone Encounter (Signed)
Called the patient back and the wife answered. I reviewed the medication dose of the immediate release. In October his weight was 134 lb and he has lost 16 lbs since then.  He currently taken 6 am, 8 am 10 am, 12 p, 2p, 4 p and then ER form he takes at bedtime and then he does repeat the ER dose during the night. He wakes up 3 times a night to pee and finds that its very hard to move and freezes up.   Since there were so many concerns I advised the patient that it may be worth having an apt over the phone to discuss this further since there were many concerns that were being mentioned. I was calling to advise that Dr Brett Fairy discussed decreasing the medications but they had multiple concerns and before making a medication change I think it may be worth discussing this over the phone. They consented to phone visit with Dr Brett Fairy for wed at 3 pm.

## 2018-10-29 NOTE — Telephone Encounter (Signed)
Reduce medication intake to 5 times daily-   8 AM and 12 noon, 4 Pm and  6 Pm - at night time extended release form of sinemet/. Marland Kitchen

## 2018-10-29 NOTE — Telephone Encounter (Signed)
Pt states the weight loss is a big concern because he is eating well. He states that he is either bouncing off the walls or cant move. There is no in between. The IR form he states is lasting 1.5 hr.   Due to Covid 19 our office is reducing our number of office visits in order to minimize the risk to our patients and healthcare providers.Our office is now providing the capability to offer the patients virtual visits or phone visits at this time. Informed of what that process looks like and informed that the Virtual visit will still be billed through insurance as such. Pt would like do the phone visit. Due to [place],informed the patient since the appointment is taking place over the phone/internet app, we can't guarantee the security of the phone line. With that said if we do move forward I would have to get verbal consent to complete the Video Visit/Phone call. Patient gave verbal consent to move forward with the video visit. I have reviewed the patient's chart and made sure that everything is up to date. Patient is also made aware that since this is a video visit we are able to complete the visit but a physical exam is not able to be done since the patient is not present in person. Pt informed that the front staff will contact the patient aprox 30 minutes prior to the scheduled appointment to "check them in" and make sure that everything is ready for the appointment to get started. Pt verbalized understanding of this information and will states to be ready for the visit at least 15-30 min prior to the visit.

## 2018-10-29 NOTE — Telephone Encounter (Signed)
Pt has called to inform that since the increase in taking the Carbidopa-Levodopa ER (SINEMET CR)  He has lost weight ad his shakes have increase at night.  Pt feels he may be over medicated.  Please call

## 2018-10-31 ENCOUNTER — Ambulatory Visit (INDEPENDENT_AMBULATORY_CARE_PROVIDER_SITE_OTHER): Payer: Medicare Other | Admitting: Neurology

## 2018-10-31 ENCOUNTER — Encounter: Payer: Self-pay | Admitting: Neurology

## 2018-10-31 ENCOUNTER — Other Ambulatory Visit: Payer: Self-pay

## 2018-10-31 DIAGNOSIS — F909 Attention-deficit hyperactivity disorder, unspecified type: Secondary | ICD-10-CM

## 2018-10-31 DIAGNOSIS — G20 Parkinson's disease: Secondary | ICD-10-CM | POA: Diagnosis not present

## 2018-10-31 DIAGNOSIS — R6889 Other general symptoms and signs: Secondary | ICD-10-CM

## 2018-10-31 DIAGNOSIS — R2689 Other abnormalities of gait and mobility: Secondary | ICD-10-CM | POA: Insufficient documentation

## 2018-10-31 MED ORDER — CARBIDOPA-LEVODOPA 25-100 MG PO TABS: ORAL_TABLET | ORAL | 9 refills | Status: DC

## 2018-10-31 MED ORDER — BUSPIRONE HCL 15 MG PO TABS: 7.5000 mg | ORAL_TABLET | Freq: Every day | ORAL | 3 refills | Status: AC

## 2018-10-31 MED ORDER — CARBIDOPA-LEVODOPA ER 25-100 MG PO TBCR: EXTENDED_RELEASE_TABLET | ORAL | 6 refills | Status: DC
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
## 2018-10-31 NOTE — Patient Instructions (Signed)
Hyperkinesis, akineses,  Freezing at end of dose.

## 2018-11-05 ENCOUNTER — Ambulatory Visit: Payer: Self-pay | Admitting: Neurology

## 2018-11-22 ENCOUNTER — Telehealth: Payer: Self-pay | Admitting: Neurology

## 2018-11-22 ENCOUNTER — Encounter: Payer: Self-pay | Admitting: Neurology

## 2018-11-22 NOTE — Telephone Encounter (Signed)
Called to schedule the pt for a 6 month follow up and the patient informed they are planning to [place] [facility] [place]. They were asking if Dr Brett Fairy or anyone here had recommendation of neurologist in that area to refer to. Advised I would get Dr Dohmeier's thoughts and let them know her recommendations. They were appreciative.

## 2018-11-22 NOTE — Telephone Encounter (Signed)
I will send a letter to the patient with Dr Dohmeier's recommendations and advise them to have their PCP send a referral. If anything else is needed from our office advised them to contact us.

## 2018-11-29 ENCOUNTER — Telehealth: Payer: Self-pay | Admitting: Neurology

## 2018-11-29 NOTE — Telephone Encounter (Signed)
I called pt. Pt reports that his PD is worsening, specifically his dyskinesias. Pt's wife reports that this was not her observation and disagrees. Pt is having freezing episodes and reports that these episodes are painful. Pt does not have any "good times". He is less 115 lbs and is concerned about his weight loss and want to know if pt can gain this muscle back. I encouraged pt and wife to speak with his PCP but they are insistent that I ask Dr. Brett Fairy and the work in doctor. I reminded them that we are closed on Fridays currently and that Dr. Brett Fairy is out of the office until Monday. They would like the [facility] and/or Dr. Edwena Felty input on their concerns as soon as possible.  Pt's meds were reviewed, he is NOT taking inbrija. Otherwise, his PD meds are correctly listed and he is taking them as prescribed.

## 2018-11-29 NOTE — Telephone Encounter (Signed)
Pt has asked for a call from RN to discuss how his Parkinson's has worsen

## 2018-12-03 ENCOUNTER — Ambulatory Visit (INDEPENDENT_AMBULATORY_CARE_PROVIDER_SITE_OTHER): Payer: Medicare Other | Admitting: Neurology

## 2018-12-03 ENCOUNTER — Encounter: Payer: Self-pay | Admitting: Neurology

## 2018-12-03 ENCOUNTER — Other Ambulatory Visit: Payer: Self-pay

## 2018-12-03 ENCOUNTER — Other Ambulatory Visit: Payer: Self-pay | Admitting: Neurology

## 2018-12-03 VITALS — BP 100/64 | HR 76 | Temp 98.6°F | Ht 67.0 in | Wt 117.0 lb

## 2018-12-03 DIAGNOSIS — G20A1 Parkinson's disease without dyskinesia, without mention of fluctuations: Secondary | ICD-10-CM

## 2018-12-03 DIAGNOSIS — G20 Parkinson's disease: Secondary | ICD-10-CM | POA: Diagnosis not present

## 2018-12-03 DIAGNOSIS — F909 Attention-deficit hyperactivity disorder, unspecified type: Secondary | ICD-10-CM | POA: Diagnosis not present

## 2018-12-03 DIAGNOSIS — Z5181 Encounter for therapeutic drug level monitoring: Secondary | ICD-10-CM

## 2018-12-03 DIAGNOSIS — R490 Dysphonia: Secondary | ICD-10-CM

## 2018-12-03 DIAGNOSIS — R531 Weakness: Secondary | ICD-10-CM

## 2018-12-03 DIAGNOSIS — R2689 Other abnormalities of gait and mobility: Secondary | ICD-10-CM

## 2018-12-03 DIAGNOSIS — R498 Other voice and resonance disorders: Secondary | ICD-10-CM

## 2018-12-03 MED ORDER — CARBIDOPA-LEVODOPA 25-100 MG PO TABS: ORAL_TABLET | ORAL | 9 refills | Status: DC

## 2018-12-03 MED ORDER — AMANTADINE HCL 100 MG PO CAPS: 100.0000 mg | ORAL_CAPSULE | Freq: Two times a day (BID) | ORAL | 5 refills | Status: DC

## 2018-12-03 MED ORDER — PRAMIPEXOLE DIHYDROCHLORIDE 0.25 MG PO TABS: 0.2500 mg | ORAL_TABLET | Freq: Two times a day (BID) | ORAL | 0 refills | Status: DC

## 2018-12-03 NOTE — Telephone Encounter (Signed)
I was able to schedule the patient to come in today for visit. Scheduled 3:30 in office visit. Advised check in of 3-3:15 pm. Patient wife verbalized understanding. They have been fever free, no contact with covid 19.

## 2018-12-03 NOTE — Addendum Note (Signed)
Addended by: Larey Seat on: 12/03/2018 04:29 PM   Modules accepted: Orders

## 2018-12-03 NOTE — Patient Instructions (Signed)
Amantadine capsules or tablets What is this medicine? AMANTADINE (a MAN ta deen) is an antiviral medicine. It is used to prevent and to treat a specific type of flu called influenza A. It will not work for colds, other types of flu, or other viral infections.  This medicine is also used to treat Parkinson's disease and other movement disorders.  This medicine may be used for other purposes; ask your health care provider or pharmacist if you have questions. COMMON BRAND NAME(S): Symmetrel What should I tell my health care provider before I take this medicine? They need to know if you have any of these conditions: -depression or other mental illness -eczema -glaucoma -if you drink alcohol -kidney disease -low blood pressure -narcolepsy -seizures -sleep apnea -suicidal thoughts, plans, or attempt; a previous suicide attempt by you or a family member -an unusual or allergic reaction to amantadine, other medicines, foods, dyes, or preservatives -pregnant or trying to get pregnant -breast-feeding How should I use this medicine? Take this medicine by mouth with a full glass of water. Follow the directions on the prescription label. Take your medicine at regular intervals. Do not take your medicine more often than directed. Take all of your medicine as directed even if you think your are better. Do not skip doses or stop your medicine early. Contact your pediatrician or health care professional regarding the useof this medicine in children. While this drug may be prescribed for children as young as 78 year old for selected conditions, precautions do apply. Patients over 35 years old may have a stronger reaction and need a smaller dose. Overdosage: If you think you have taken too much of this medicine contact a poison control center or emergency room at once. NOTE: This medicine is only for you. Do not share this medicine with others. What if I miss a dose? If you miss a dose, take it as soon as you  can. If it is almost time for your next dose, take only that dose. Do not take double or extra doses. What may interact with this medicine? -acetazolamide -alcohol -atropine -antihistamines for allergy, cough and cold -benztropine -bupropion -certain medicines for bladder problems like oxybutynin, tolterodine -certain medicines for stomach problems like dicyclomine, hyoscyamine -certain medicines for travel sickness like scopolamine -ipratropium -methazolamide -quinidine -quinine -sodium bicarbonate -some flu vaccines -thioridazine  This list may not describe all possible interactions. Give your health care provider a list of all the medicines, herbs, non-prescription drugs, or dietary supplements you use. Also tell them if you smoke, drink alcohol, or use illegal drugs. Some items may interact with your medicine. What should I watch for while using this medicine? Tell your doctor or health care professional if your symptoms do not improve. You may get drowsy or dizzy. Do not drive, use machinery, or do anything that needs mental alertness until you know how this medicine affects you. Do not stand or sit up quickly, especially if you are an older patient. This reduces the risk of dizzy or fainting spells. Alcohol may interfere with the effect of this medicine. Avoid alcoholic drinks. If you are taking this medicine for Parkinson's disease or a movement disorder, be careful. Slowly increase your daily activities as your condition improves. Do not suddenly stop taking your medicine because you may develop a severe reaction. You may get dry mouth or eyes, or blurry vision while taking this medicine. Try sugarless gum or hard candy, and drink 6 to 8 glasses of water daily. Brush and floss  your teeth regularly and carefully to avoid teeth and gum problems. You may want to wet your eyes with lubricating eye drops. Talk to your doctor if these symptoms become a problem. There have been reports of  increased sexual urges or other strong urges such as gambling while taking some medicines for Parkinson's disease. If you experience any of these urges while taking this medicine, you should report it to your health care provider as soon as possible. You should check your skin often for changes to moles and new growths while taking this medicine. Call your doctor if you notice any of these changes. What side effects may I notice from receiving this medicine? Side effects that you should report to your doctor or health care professional as soon as possible: -allergic reactions like skin rash, itching or hives, swelling of the face, lips, or tongue -anxiety -breathing problems -changes in vision -color changes on the skin -confusion -depressed mood -eye pain -falling asleep during normal activities like driving -hallucination, loss of contact with reality -new or increased gambling urges, sexual urges, uncontrolled spending, binge or compulsive eating, or other urges -seizures -signs and symptoms of low blood pressure like dizziness; feeling faint or lightheaded, falls; unusually weak or tired -swelling in your legs and feet -suicidal thoughts or other mood changes -trouble passing urine or change in the amount of urine -trouble sleeping -uncontrolled movements of the mouth, head, hands, feet, shoulders, eyelids or other unusual muscle movements Side effects that usually do not require medical attention (report these to your doctor or health care professional if they continue or are bothersome): -constipation -dizziness -drowsiness -dry mouth -headache -nausea This list may not describe all possible side effects. Call your doctor for medical advice about side effects. You may report side effects to FDA at 1-800-FDA-1088. Where should I keep my medicine? Keep out of the reach of children. Store at room temperature between 20 and 25 degrees C (68 and 77 degrees F). Keep container tightly  closed. Throw away any unused medicine after the expiration date. NOTE: This sheet is a summary. It may not cover all possible information. If you have questions about this medicine, talk to your doctor, pharmacist, or health care provider.  2019 Elsevier/Gold Standard (2016-03-04 12:06:00)

## 2018-12-03 NOTE — Telephone Encounter (Signed)
I would like a virtual visit with Tim [name], if not possible , need to see him live.
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
## 2018-12-06 ENCOUNTER — Other Ambulatory Visit: Payer: Self-pay | Admitting: Neurology

## 2018-12-06 DIAGNOSIS — G20A1 Parkinson's disease without dyskinesia, without mention of fluctuations: Secondary | ICD-10-CM

## 2018-12-06 DIAGNOSIS — F909 Attention-deficit hyperactivity disorder, unspecified type: Secondary | ICD-10-CM

## 2018-12-06 DIAGNOSIS — R2689 Other abnormalities of gait and mobility: Secondary | ICD-10-CM

## 2018-12-06 DIAGNOSIS — G20 Parkinson's disease: Secondary | ICD-10-CM

## 2018-12-13 DIAGNOSIS — G20 Parkinson's disease: Secondary | ICD-10-CM | POA: Diagnosis not present

## 2018-12-13 DIAGNOSIS — Z639 Problem related to primary support group, unspecified: Secondary | ICD-10-CM | POA: Diagnosis not present

## 2018-12-13 DIAGNOSIS — G259 Extrapyramidal and movement disorder, unspecified: Secondary | ICD-10-CM | POA: Diagnosis not present

## 2018-12-25 ENCOUNTER — Telehealth: Payer: Self-pay | Admitting: Neurology

## 2018-12-25 ENCOUNTER — Other Ambulatory Visit: Payer: Self-pay | Admitting: Neurology

## 2018-12-25 MED ORDER — CARBIDOPA-LEVODOPA 25-100 MG PO TABS: ORAL_TABLET | ORAL | 9 refills | Status: DC

## 2018-12-25 NOTE — Telephone Encounter (Signed)
No replacement- will just d/c the medication.

## 2018-12-25 NOTE — Addendum Note (Signed)
Addended by: Darleen Crocker on: 12/25/2018 02:18 PM   Modules accepted: Orders

## 2018-12-25 NOTE — Telephone Encounter (Signed)
Pt called stating he has had a negative reaction to amantadine (SYMMETREL) 100 MG capsule.  Husband is asking for a call re: what medication Dr Brett Fairy will replace the amantadine (SYMMETREL) 100 MG capsule with

## 2018-12-25 NOTE — Telephone Encounter (Signed)
Called the patient to let him know that Dr Dohmeier didn't have another suggestion on a medication. Advised that he could just dc the amantadine since he was having hallucinations. Patient states that he is still struggling though with the current dosage and regimen of the IR form of sinemet. He states that around 2-5 mark he is struggling make it until 5 to wait and take medication. The tensing and pain comes up about 4 pm and last 30-45 min and then when he takes his dose he is fine until about 2 hrs later. Advised the patient I spoke with Dr Brett Fairy and she was fine with adding another pill totalling 8 a day for the patient.  NEW MED time will be  7 am, 10 am, 12 noon, 2 pm, 4 pm, 6pm, 8 pm and 11 pm. I will send a new script to reflect the 1 pill being added to the doses. Pt verbalized understanding.

## 2018-12-26 DIAGNOSIS — R351 Nocturia: Secondary | ICD-10-CM | POA: Diagnosis not present

## 2018-12-26 DIAGNOSIS — N401 Enlarged prostate with lower urinary tract symptoms: Secondary | ICD-10-CM | POA: Diagnosis not present

## 2019-01-14 DIAGNOSIS — I959 Hypotension, unspecified: Secondary | ICD-10-CM | POA: Diagnosis not present

## 2019-01-14 DIAGNOSIS — Z0000 Encounter for general adult medical examination without abnormal findings: Secondary | ICD-10-CM | POA: Diagnosis not present

## 2019-01-14 DIAGNOSIS — Z79899 Other long term (current) drug therapy: Secondary | ICD-10-CM | POA: Diagnosis not present

## 2019-01-14 DIAGNOSIS — E46 Unspecified protein-calorie malnutrition: Secondary | ICD-10-CM | POA: Diagnosis not present

## 2019-01-14 DIAGNOSIS — F419 Anxiety disorder, unspecified: Secondary | ICD-10-CM | POA: Diagnosis not present

## 2019-01-14 DIAGNOSIS — E7800 Pure hypercholesterolemia, unspecified: Secondary | ICD-10-CM | POA: Diagnosis not present

## 2019-03-05 ENCOUNTER — Other Ambulatory Visit: Payer: Self-pay | Admitting: Neurology

## 2019-03-11 DIAGNOSIS — M4802 Spinal stenosis, cervical region: Secondary | ICD-10-CM | POA: Diagnosis not present

## 2019-03-11 DIAGNOSIS — Z043 Encounter for examination and observation following other accident: Secondary | ICD-10-CM | POA: Diagnosis not present

## 2019-03-11 DIAGNOSIS — M47816 Spondylosis without myelopathy or radiculopathy, lumbar region: Secondary | ICD-10-CM | POA: Diagnosis not present

## 2019-03-11 DIAGNOSIS — M5126 Other intervertebral disc displacement, lumbar region: Secondary | ICD-10-CM | POA: Diagnosis not present

## 2019-03-11 DIAGNOSIS — J329 Chronic sinusitis, unspecified: Secondary | ICD-10-CM | POA: Diagnosis not present

## 2019-03-11 DIAGNOSIS — M50322 Other cervical disc degeneration at C5-C6 level: Secondary | ICD-10-CM | POA: Diagnosis not present

## 2019-03-11 DIAGNOSIS — M47817 Spondylosis without myelopathy or radiculopathy, lumbosacral region: Secondary | ICD-10-CM | POA: Diagnosis not present

## 2019-03-11 DIAGNOSIS — G238 Other specified degenerative diseases of basal ganglia: Secondary | ICD-10-CM | POA: Diagnosis not present

## 2019-03-11 DIAGNOSIS — Z23 Encounter for immunization: Secondary | ICD-10-CM | POA: Diagnosis not present

## 2019-03-11 DIAGNOSIS — M545 Low back pain: Secondary | ICD-10-CM | POA: Diagnosis not present

## 2019-03-11 DIAGNOSIS — G9389 Other specified disorders of brain: Secondary | ICD-10-CM | POA: Diagnosis not present

## 2019-03-11 DIAGNOSIS — G20 Parkinson's disease: Secondary | ICD-10-CM | POA: Diagnosis not present

## 2019-03-11 DIAGNOSIS — S30810A Abrasion of lower back and pelvis, initial encounter: Secondary | ICD-10-CM | POA: Diagnosis not present

## 2019-03-11 DIAGNOSIS — M47812 Spondylosis without myelopathy or radiculopathy, cervical region: Secondary | ICD-10-CM | POA: Diagnosis not present

## 2019-03-11 DIAGNOSIS — S0083XA Contusion of other part of head, initial encounter: Secondary | ICD-10-CM | POA: Diagnosis not present

## 2019-03-26 ENCOUNTER — Telehealth: Payer: Self-pay

## 2019-03-26 ENCOUNTER — Telehealth: Payer: Self-pay | Admitting: Neurology

## 2019-03-26 DIAGNOSIS — R531 Weakness: Secondary | ICD-10-CM

## 2019-03-26 DIAGNOSIS — G20 Parkinson's disease: Secondary | ICD-10-CM

## 2019-03-26 DIAGNOSIS — R498 Other voice and resonance disorders: Secondary | ICD-10-CM

## 2019-03-26 DIAGNOSIS — Z5181 Encounter for therapeutic drug level monitoring: Secondary | ICD-10-CM

## 2019-03-26 DIAGNOSIS — R490 Dysphonia: Secondary | ICD-10-CM

## 2019-03-26 DIAGNOSIS — F909 Attention-deficit hyperactivity disorder, unspecified type: Secondary | ICD-10-CM

## 2019-03-26 DIAGNOSIS — R2689 Other abnormalities of gait and mobility: Secondary | ICD-10-CM

## 2019-03-26 NOTE — Telephone Encounter (Signed)
Olivia Mackie From [facility] neurology in [place] called asking for the patients referral and demographics sheet. She said that she received the rest of his records. Her phone number is [phone] select option 2 then select option 4 then her ext is [phone]. The Fax number is [phone].

## 2019-03-26 NOTE — Telephone Encounter (Signed)
Dr. Raynald Blend Tel: [phone] Fax: [phone]  Patient needs a referral and his records sent to the Neurologist listed above in [place]

## 2019-03-26 NOTE — Telephone Encounter (Signed)
Order written for neurology referral to be completed, I will also alert our medical records personnel to send over records for the patient.

## 2019-03-26 NOTE — Addendum Note (Signed)
Addended by: Darleen Crocker on: 03/26/2019 03:35 PM   Modules accepted: Orders

## 2019-03-27 NOTE — Telephone Encounter (Signed)
Done

## 2019-04-01 DIAGNOSIS — Z23 Encounter for immunization: Secondary | ICD-10-CM | POA: Diagnosis not present

## 2019-05-01 DIAGNOSIS — R399 Unspecified symptoms and signs involving the genitourinary system: Secondary | ICD-10-CM | POA: Diagnosis not present

## 2019-05-01 DIAGNOSIS — R634 Abnormal weight loss: Secondary | ICD-10-CM | POA: Diagnosis not present

## 2019-05-01 DIAGNOSIS — S41112A Laceration without foreign body of left upper arm, initial encounter: Secondary | ICD-10-CM | POA: Diagnosis not present

## 2019-05-01 DIAGNOSIS — Z86010 Personal history of colonic polyps: Secondary | ICD-10-CM | POA: Diagnosis not present

## 2019-05-01 DIAGNOSIS — M1990 Unspecified osteoarthritis, unspecified site: Secondary | ICD-10-CM | POA: Diagnosis not present

## 2019-05-01 DIAGNOSIS — F411 Generalized anxiety disorder: Secondary | ICD-10-CM | POA: Diagnosis not present

## 2019-05-01 DIAGNOSIS — G20 Parkinson's disease: Secondary | ICD-10-CM | POA: Diagnosis not present

## 2019-05-09 DIAGNOSIS — R634 Abnormal weight loss: Secondary | ICD-10-CM | POA: Diagnosis not present

## 2019-05-09 DIAGNOSIS — R0602 Shortness of breath: Secondary | ICD-10-CM | POA: Diagnosis not present

## 2019-05-14 DIAGNOSIS — N319 Neuromuscular dysfunction of bladder, unspecified: Secondary | ICD-10-CM | POA: Diagnosis not present

## 2019-05-14 DIAGNOSIS — R39198 Other difficulties with micturition: Secondary | ICD-10-CM | POA: Diagnosis not present

## 2019-05-14 DIAGNOSIS — Z87448 Personal history of other diseases of urinary system: Secondary | ICD-10-CM | POA: Diagnosis not present

## 2019-05-14 DIAGNOSIS — N3941 Urge incontinence: Secondary | ICD-10-CM | POA: Diagnosis not present

## 2019-05-20 DIAGNOSIS — I771 Stricture of artery: Secondary | ICD-10-CM | POA: Diagnosis not present

## 2019-05-20 DIAGNOSIS — R4182 Altered mental status, unspecified: Secondary | ICD-10-CM | POA: Diagnosis not present

## 2019-05-20 DIAGNOSIS — M545 Low back pain: Secondary | ICD-10-CM | POA: Diagnosis not present

## 2019-05-20 DIAGNOSIS — G8929 Other chronic pain: Secondary | ICD-10-CM | POA: Diagnosis not present

## 2019-05-20 DIAGNOSIS — G20 Parkinson's disease: Secondary | ICD-10-CM | POA: Diagnosis not present

## 2019-05-20 DIAGNOSIS — R9431 Abnormal electrocardiogram [ECG] [EKG]: Secondary | ICD-10-CM | POA: Diagnosis not present

## 2019-05-20 DIAGNOSIS — R531 Weakness: Secondary | ICD-10-CM | POA: Diagnosis not present

## 2019-05-20 DIAGNOSIS — I493 Ventricular premature depolarization: Secondary | ICD-10-CM | POA: Diagnosis not present

## 2019-05-20 DIAGNOSIS — R52 Pain, unspecified: Secondary | ICD-10-CM | POA: Diagnosis not present

## 2019-05-20 DIAGNOSIS — R5383 Other fatigue: Secondary | ICD-10-CM | POA: Diagnosis not present

## 2019-05-21 DIAGNOSIS — F909 Attention-deficit hyperactivity disorder, unspecified type: Secondary | ICD-10-CM | POA: Diagnosis not present

## 2019-05-21 DIAGNOSIS — R2689 Other abnormalities of gait and mobility: Secondary | ICD-10-CM | POA: Diagnosis not present

## 2019-05-21 DIAGNOSIS — G20 Parkinson's disease: Secondary | ICD-10-CM | POA: Diagnosis not present

## 2019-05-21 DIAGNOSIS — R490 Dysphonia: Secondary | ICD-10-CM | POA: Diagnosis not present

## 2019-05-28 DIAGNOSIS — N319 Neuromuscular dysfunction of bladder, unspecified: Secondary | ICD-10-CM | POA: Diagnosis not present

## 2019-05-29 DIAGNOSIS — G20 Parkinson's disease: Secondary | ICD-10-CM | POA: Diagnosis not present

## 2019-05-29 DIAGNOSIS — R339 Retention of urine, unspecified: Secondary | ICD-10-CM | POA: Diagnosis not present

## 2019-05-29 DIAGNOSIS — K5900 Constipation, unspecified: Secondary | ICD-10-CM | POA: Diagnosis not present

## 2019-06-05 DIAGNOSIS — N319 Neuromuscular dysfunction of bladder, unspecified: Secondary | ICD-10-CM | POA: Diagnosis not present

## 2019-06-05 DIAGNOSIS — N3941 Urge incontinence: Secondary | ICD-10-CM | POA: Diagnosis not present

## 2019-06-06 DIAGNOSIS — S0003XA Contusion of scalp, initial encounter: Secondary | ICD-10-CM | POA: Diagnosis not present

## 2019-06-06 DIAGNOSIS — M79622 Pain in left upper arm: Secondary | ICD-10-CM | POA: Diagnosis not present

## 2019-06-06 DIAGNOSIS — S0990XA Unspecified injury of head, initial encounter: Secondary | ICD-10-CM | POA: Diagnosis not present

## 2019-06-06 DIAGNOSIS — G20 Parkinson's disease: Secondary | ICD-10-CM | POA: Diagnosis not present

## 2019-06-06 DIAGNOSIS — M79644 Pain in right finger(s): Secondary | ICD-10-CM | POA: Diagnosis not present

## 2019-06-06 DIAGNOSIS — R531 Weakness: Secondary | ICD-10-CM | POA: Diagnosis not present

## 2019-06-06 DIAGNOSIS — W1811XA Fall from or off toilet without subsequent striking against object, initial encounter: Secondary | ICD-10-CM | POA: Diagnosis not present

## 2019-06-06 DIAGNOSIS — M79632 Pain in left forearm: Secondary | ICD-10-CM | POA: Diagnosis not present

## 2019-06-06 DIAGNOSIS — Z7409 Other reduced mobility: Secondary | ICD-10-CM | POA: Diagnosis not present

## 2019-06-11 DIAGNOSIS — N319 Neuromuscular dysfunction of bladder, unspecified: Secondary | ICD-10-CM | POA: Diagnosis not present

## 2019-06-11 DIAGNOSIS — N3941 Urge incontinence: Secondary | ICD-10-CM | POA: Diagnosis not present

## 2019-06-11 DIAGNOSIS — Z87448 Personal history of other diseases of urinary system: Secondary | ICD-10-CM | POA: Diagnosis not present

## 2019-06-12 DIAGNOSIS — G20 Parkinson's disease: Secondary | ICD-10-CM | POA: Diagnosis not present

## 2019-06-14 DIAGNOSIS — R296 Repeated falls: Secondary | ICD-10-CM | POA: Diagnosis not present

## 2019-06-14 DIAGNOSIS — G20 Parkinson's disease: Secondary | ICD-10-CM | POA: Diagnosis not present

## 2019-06-14 DIAGNOSIS — Z87440 Personal history of urinary (tract) infections: Secondary | ICD-10-CM | POA: Diagnosis not present

## 2019-06-14 DIAGNOSIS — N400 Enlarged prostate without lower urinary tract symptoms: Secondary | ICD-10-CM | POA: Diagnosis not present

## 2019-06-14 DIAGNOSIS — G894 Chronic pain syndrome: Secondary | ICD-10-CM | POA: Diagnosis not present

## 2019-06-14 DIAGNOSIS — M1990 Unspecified osteoarthritis, unspecified site: Secondary | ICD-10-CM | POA: Diagnosis not present

## 2019-06-14 DIAGNOSIS — H9190 Unspecified hearing loss, unspecified ear: Secondary | ICD-10-CM | POA: Diagnosis not present

## 2019-06-14 DIAGNOSIS — Z9181 History of falling: Secondary | ICD-10-CM | POA: Diagnosis not present

## 2019-06-14 DIAGNOSIS — F419 Anxiety disorder, unspecified: Secondary | ICD-10-CM | POA: Diagnosis not present

## 2019-06-15 DIAGNOSIS — H9190 Unspecified hearing loss, unspecified ear: Secondary | ICD-10-CM | POA: Diagnosis not present

## 2019-06-15 DIAGNOSIS — M1990 Unspecified osteoarthritis, unspecified site: Secondary | ICD-10-CM | POA: Diagnosis not present

## 2019-06-15 DIAGNOSIS — R296 Repeated falls: Secondary | ICD-10-CM | POA: Diagnosis not present

## 2019-06-15 DIAGNOSIS — G894 Chronic pain syndrome: Secondary | ICD-10-CM | POA: Diagnosis not present

## 2019-06-15 DIAGNOSIS — F419 Anxiety disorder, unspecified: Secondary | ICD-10-CM | POA: Diagnosis not present

## 2019-06-15 DIAGNOSIS — G20 Parkinson's disease: Secondary | ICD-10-CM | POA: Diagnosis not present

## 2019-06-17 DIAGNOSIS — M1990 Unspecified osteoarthritis, unspecified site: Secondary | ICD-10-CM | POA: Diagnosis not present

## 2019-06-17 DIAGNOSIS — F419 Anxiety disorder, unspecified: Secondary | ICD-10-CM | POA: Diagnosis not present

## 2019-06-17 DIAGNOSIS — R296 Repeated falls: Secondary | ICD-10-CM | POA: Diagnosis not present

## 2019-06-17 DIAGNOSIS — G20 Parkinson's disease: Secondary | ICD-10-CM | POA: Diagnosis not present

## 2019-06-17 DIAGNOSIS — H9190 Unspecified hearing loss, unspecified ear: Secondary | ICD-10-CM | POA: Diagnosis not present

## 2019-06-17 DIAGNOSIS — G894 Chronic pain syndrome: Secondary | ICD-10-CM | POA: Diagnosis not present

## 2019-06-19 DIAGNOSIS — N390 Urinary tract infection, site not specified: Secondary | ICD-10-CM | POA: Diagnosis not present

## 2019-06-19 DIAGNOSIS — S0990XA Unspecified injury of head, initial encounter: Secondary | ICD-10-CM | POA: Diagnosis not present

## 2019-06-20 DIAGNOSIS — H9190 Unspecified hearing loss, unspecified ear: Secondary | ICD-10-CM | POA: Diagnosis not present

## 2019-06-20 DIAGNOSIS — M1990 Unspecified osteoarthritis, unspecified site: Secondary | ICD-10-CM | POA: Diagnosis not present

## 2019-06-20 DIAGNOSIS — R296 Repeated falls: Secondary | ICD-10-CM | POA: Diagnosis not present

## 2019-06-20 DIAGNOSIS — F419 Anxiety disorder, unspecified: Secondary | ICD-10-CM | POA: Diagnosis not present

## 2019-06-20 DIAGNOSIS — G20 Parkinson's disease: Secondary | ICD-10-CM | POA: Diagnosis not present

## 2019-06-20 DIAGNOSIS — G894 Chronic pain syndrome: Secondary | ICD-10-CM | POA: Diagnosis not present

## 2019-06-24 DIAGNOSIS — G20 Parkinson's disease: Secondary | ICD-10-CM | POA: Diagnosis not present

## 2019-06-24 DIAGNOSIS — R296 Repeated falls: Secondary | ICD-10-CM | POA: Diagnosis not present

## 2019-06-24 DIAGNOSIS — F419 Anxiety disorder, unspecified: Secondary | ICD-10-CM | POA: Diagnosis not present

## 2019-06-24 DIAGNOSIS — H9190 Unspecified hearing loss, unspecified ear: Secondary | ICD-10-CM | POA: Diagnosis not present

## 2019-06-24 DIAGNOSIS — G894 Chronic pain syndrome: Secondary | ICD-10-CM | POA: Diagnosis not present

## 2019-06-24 DIAGNOSIS — M1990 Unspecified osteoarthritis, unspecified site: Secondary | ICD-10-CM | POA: Diagnosis not present

## 2019-06-26 DIAGNOSIS — M1990 Unspecified osteoarthritis, unspecified site: Secondary | ICD-10-CM | POA: Diagnosis not present

## 2019-06-26 DIAGNOSIS — G20 Parkinson's disease: Secondary | ICD-10-CM | POA: Diagnosis not present

## 2019-06-26 DIAGNOSIS — H9190 Unspecified hearing loss, unspecified ear: Secondary | ICD-10-CM | POA: Diagnosis not present

## 2019-06-26 DIAGNOSIS — R296 Repeated falls: Secondary | ICD-10-CM | POA: Diagnosis not present

## 2019-06-26 DIAGNOSIS — F419 Anxiety disorder, unspecified: Secondary | ICD-10-CM | POA: Diagnosis not present

## 2019-06-26 DIAGNOSIS — G894 Chronic pain syndrome: Secondary | ICD-10-CM | POA: Diagnosis not present

## 2019-06-27 DIAGNOSIS — H9190 Unspecified hearing loss, unspecified ear: Secondary | ICD-10-CM | POA: Diagnosis not present

## 2019-06-27 DIAGNOSIS — G20 Parkinson's disease: Secondary | ICD-10-CM | POA: Diagnosis not present

## 2019-06-27 DIAGNOSIS — F419 Anxiety disorder, unspecified: Secondary | ICD-10-CM | POA: Diagnosis not present

## 2019-06-27 DIAGNOSIS — R296 Repeated falls: Secondary | ICD-10-CM | POA: Diagnosis not present

## 2019-06-27 DIAGNOSIS — G894 Chronic pain syndrome: Secondary | ICD-10-CM | POA: Diagnosis not present

## 2019-06-27 DIAGNOSIS — M1990 Unspecified osteoarthritis, unspecified site: Secondary | ICD-10-CM | POA: Diagnosis not present

## 2019-07-01 DIAGNOSIS — G894 Chronic pain syndrome: Secondary | ICD-10-CM | POA: Diagnosis not present

## 2019-07-01 DIAGNOSIS — M1990 Unspecified osteoarthritis, unspecified site: Secondary | ICD-10-CM | POA: Diagnosis not present

## 2019-07-01 DIAGNOSIS — Z87448 Personal history of other diseases of urinary system: Secondary | ICD-10-CM | POA: Diagnosis not present

## 2019-07-01 DIAGNOSIS — F419 Anxiety disorder, unspecified: Secondary | ICD-10-CM | POA: Diagnosis not present

## 2019-07-01 DIAGNOSIS — R296 Repeated falls: Secondary | ICD-10-CM | POA: Diagnosis not present

## 2019-07-01 DIAGNOSIS — H9190 Unspecified hearing loss, unspecified ear: Secondary | ICD-10-CM | POA: Diagnosis not present

## 2019-07-01 DIAGNOSIS — G20 Parkinson's disease: Secondary | ICD-10-CM | POA: Diagnosis not present

## 2019-07-02 DIAGNOSIS — H9190 Unspecified hearing loss, unspecified ear: Secondary | ICD-10-CM | POA: Diagnosis not present

## 2019-07-02 DIAGNOSIS — G20 Parkinson's disease: Secondary | ICD-10-CM | POA: Diagnosis not present

## 2019-07-02 DIAGNOSIS — G894 Chronic pain syndrome: Secondary | ICD-10-CM | POA: Diagnosis not present

## 2019-07-02 DIAGNOSIS — R296 Repeated falls: Secondary | ICD-10-CM | POA: Diagnosis not present

## 2019-07-02 DIAGNOSIS — F419 Anxiety disorder, unspecified: Secondary | ICD-10-CM | POA: Diagnosis not present

## 2019-07-02 DIAGNOSIS — M1990 Unspecified osteoarthritis, unspecified site: Secondary | ICD-10-CM | POA: Diagnosis not present

## 2019-07-03 DIAGNOSIS — H9190 Unspecified hearing loss, unspecified ear: Secondary | ICD-10-CM | POA: Diagnosis not present

## 2019-07-03 DIAGNOSIS — G20 Parkinson's disease: Secondary | ICD-10-CM | POA: Diagnosis not present

## 2019-07-03 DIAGNOSIS — R296 Repeated falls: Secondary | ICD-10-CM | POA: Diagnosis not present

## 2019-07-03 DIAGNOSIS — F419 Anxiety disorder, unspecified: Secondary | ICD-10-CM | POA: Diagnosis not present

## 2019-07-03 DIAGNOSIS — M1990 Unspecified osteoarthritis, unspecified site: Secondary | ICD-10-CM | POA: Diagnosis not present

## 2019-07-03 DIAGNOSIS — G894 Chronic pain syndrome: Secondary | ICD-10-CM | POA: Diagnosis not present

## 2019-07-04 DIAGNOSIS — F419 Anxiety disorder, unspecified: Secondary | ICD-10-CM | POA: Diagnosis not present

## 2019-07-04 DIAGNOSIS — G20 Parkinson's disease: Secondary | ICD-10-CM | POA: Diagnosis not present

## 2019-07-04 DIAGNOSIS — R296 Repeated falls: Secondary | ICD-10-CM | POA: Diagnosis not present

## 2019-07-04 DIAGNOSIS — M1990 Unspecified osteoarthritis, unspecified site: Secondary | ICD-10-CM | POA: Diagnosis not present

## 2019-07-04 DIAGNOSIS — H9190 Unspecified hearing loss, unspecified ear: Secondary | ICD-10-CM | POA: Diagnosis not present

## 2019-07-04 DIAGNOSIS — G894 Chronic pain syndrome: Secondary | ICD-10-CM | POA: Diagnosis not present

## 2019-07-10 DIAGNOSIS — G894 Chronic pain syndrome: Secondary | ICD-10-CM | POA: Diagnosis not present

## 2019-07-10 DIAGNOSIS — M1990 Unspecified osteoarthritis, unspecified site: Secondary | ICD-10-CM | POA: Diagnosis not present

## 2019-07-10 DIAGNOSIS — R296 Repeated falls: Secondary | ICD-10-CM | POA: Diagnosis not present

## 2019-07-10 DIAGNOSIS — G20 Parkinson's disease: Secondary | ICD-10-CM | POA: Diagnosis not present

## 2019-07-10 DIAGNOSIS — H9190 Unspecified hearing loss, unspecified ear: Secondary | ICD-10-CM | POA: Diagnosis not present

## 2019-07-10 DIAGNOSIS — F419 Anxiety disorder, unspecified: Secondary | ICD-10-CM | POA: Diagnosis not present

## 2019-07-11 DIAGNOSIS — H9190 Unspecified hearing loss, unspecified ear: Secondary | ICD-10-CM | POA: Diagnosis not present

## 2019-07-11 DIAGNOSIS — G894 Chronic pain syndrome: Secondary | ICD-10-CM | POA: Diagnosis not present

## 2019-07-11 DIAGNOSIS — F419 Anxiety disorder, unspecified: Secondary | ICD-10-CM | POA: Diagnosis not present

## 2019-07-11 DIAGNOSIS — M1990 Unspecified osteoarthritis, unspecified site: Secondary | ICD-10-CM | POA: Diagnosis not present

## 2019-07-11 DIAGNOSIS — G20 Parkinson's disease: Secondary | ICD-10-CM | POA: Diagnosis not present

## 2019-07-11 DIAGNOSIS — R296 Repeated falls: Secondary | ICD-10-CM | POA: Diagnosis not present

## 2019-07-14 ENCOUNTER — Other Ambulatory Visit: Payer: Self-pay | Admitting: Neurology

## 2019-07-14 DIAGNOSIS — F419 Anxiety disorder, unspecified: Secondary | ICD-10-CM | POA: Diagnosis not present

## 2019-07-14 DIAGNOSIS — M1990 Unspecified osteoarthritis, unspecified site: Secondary | ICD-10-CM | POA: Diagnosis not present

## 2019-07-14 DIAGNOSIS — H9190 Unspecified hearing loss, unspecified ear: Secondary | ICD-10-CM | POA: Diagnosis not present

## 2019-07-14 DIAGNOSIS — R296 Repeated falls: Secondary | ICD-10-CM | POA: Diagnosis not present

## 2019-07-14 DIAGNOSIS — Z9181 History of falling: Secondary | ICD-10-CM | POA: Diagnosis not present

## 2019-07-14 DIAGNOSIS — G20 Parkinson's disease: Secondary | ICD-10-CM | POA: Diagnosis not present

## 2019-07-14 DIAGNOSIS — Z87440 Personal history of urinary (tract) infections: Secondary | ICD-10-CM | POA: Diagnosis not present

## 2019-07-14 DIAGNOSIS — N400 Enlarged prostate without lower urinary tract symptoms: Secondary | ICD-10-CM | POA: Diagnosis not present

## 2019-07-14 DIAGNOSIS — G894 Chronic pain syndrome: Secondary | ICD-10-CM | POA: Diagnosis not present

## 2019-07-16 DIAGNOSIS — M1990 Unspecified osteoarthritis, unspecified site: Secondary | ICD-10-CM | POA: Diagnosis not present

## 2019-07-16 DIAGNOSIS — H9190 Unspecified hearing loss, unspecified ear: Secondary | ICD-10-CM | POA: Diagnosis not present

## 2019-07-16 DIAGNOSIS — G20 Parkinson's disease: Secondary | ICD-10-CM | POA: Diagnosis not present

## 2019-07-16 DIAGNOSIS — G894 Chronic pain syndrome: Secondary | ICD-10-CM | POA: Diagnosis not present

## 2019-07-16 DIAGNOSIS — R296 Repeated falls: Secondary | ICD-10-CM | POA: Diagnosis not present

## 2019-07-16 DIAGNOSIS — F419 Anxiety disorder, unspecified: Secondary | ICD-10-CM | POA: Diagnosis not present

## 2019-07-17 DIAGNOSIS — G20 Parkinson's disease: Secondary | ICD-10-CM | POA: Diagnosis not present

## 2019-07-17 DIAGNOSIS — G894 Chronic pain syndrome: Secondary | ICD-10-CM | POA: Diagnosis not present

## 2019-07-17 DIAGNOSIS — H9190 Unspecified hearing loss, unspecified ear: Secondary | ICD-10-CM | POA: Diagnosis not present

## 2019-07-17 DIAGNOSIS — F419 Anxiety disorder, unspecified: Secondary | ICD-10-CM | POA: Diagnosis not present

## 2019-07-17 DIAGNOSIS — R296 Repeated falls: Secondary | ICD-10-CM | POA: Diagnosis not present

## 2019-07-17 DIAGNOSIS — M1990 Unspecified osteoarthritis, unspecified site: Secondary | ICD-10-CM | POA: Diagnosis not present

## 2019-07-19 DIAGNOSIS — G20 Parkinson's disease: Secondary | ICD-10-CM | POA: Diagnosis not present

## 2019-07-19 DIAGNOSIS — M1990 Unspecified osteoarthritis, unspecified site: Secondary | ICD-10-CM | POA: Diagnosis not present

## 2019-07-19 DIAGNOSIS — H9190 Unspecified hearing loss, unspecified ear: Secondary | ICD-10-CM | POA: Diagnosis not present

## 2019-07-19 DIAGNOSIS — G894 Chronic pain syndrome: Secondary | ICD-10-CM | POA: Diagnosis not present

## 2019-07-19 DIAGNOSIS — R296 Repeated falls: Secondary | ICD-10-CM | POA: Diagnosis not present

## 2019-07-19 DIAGNOSIS — F419 Anxiety disorder, unspecified: Secondary | ICD-10-CM | POA: Diagnosis not present

## 2019-07-23 DIAGNOSIS — F419 Anxiety disorder, unspecified: Secondary | ICD-10-CM | POA: Diagnosis not present

## 2019-07-23 DIAGNOSIS — H9190 Unspecified hearing loss, unspecified ear: Secondary | ICD-10-CM | POA: Diagnosis not present

## 2019-07-23 DIAGNOSIS — G20 Parkinson's disease: Secondary | ICD-10-CM | POA: Diagnosis not present

## 2019-07-23 DIAGNOSIS — M1990 Unspecified osteoarthritis, unspecified site: Secondary | ICD-10-CM | POA: Diagnosis not present

## 2019-07-23 DIAGNOSIS — G894 Chronic pain syndrome: Secondary | ICD-10-CM | POA: Diagnosis not present

## 2019-07-23 DIAGNOSIS — R296 Repeated falls: Secondary | ICD-10-CM | POA: Diagnosis not present

## 2019-07-25 DIAGNOSIS — M1990 Unspecified osteoarthritis, unspecified site: Secondary | ICD-10-CM | POA: Diagnosis not present

## 2019-07-25 DIAGNOSIS — G894 Chronic pain syndrome: Secondary | ICD-10-CM | POA: Diagnosis not present

## 2019-07-25 DIAGNOSIS — H9190 Unspecified hearing loss, unspecified ear: Secondary | ICD-10-CM | POA: Diagnosis not present

## 2019-07-25 DIAGNOSIS — G20 Parkinson's disease: Secondary | ICD-10-CM | POA: Diagnosis not present

## 2019-07-25 DIAGNOSIS — R296 Repeated falls: Secondary | ICD-10-CM | POA: Diagnosis not present

## 2019-07-25 DIAGNOSIS — F419 Anxiety disorder, unspecified: Secondary | ICD-10-CM | POA: Diagnosis not present

## 2019-07-28 ENCOUNTER — Other Ambulatory Visit: Payer: Self-pay | Admitting: Neurology

## 2019-07-31 DIAGNOSIS — G20 Parkinson's disease: Secondary | ICD-10-CM | POA: Diagnosis not present

## 2019-08-01 DIAGNOSIS — G894 Chronic pain syndrome: Secondary | ICD-10-CM | POA: Diagnosis not present

## 2019-08-01 DIAGNOSIS — M1990 Unspecified osteoarthritis, unspecified site: Secondary | ICD-10-CM | POA: Diagnosis not present

## 2019-08-01 DIAGNOSIS — H9190 Unspecified hearing loss, unspecified ear: Secondary | ICD-10-CM | POA: Diagnosis not present

## 2019-08-01 DIAGNOSIS — G20 Parkinson's disease: Secondary | ICD-10-CM | POA: Diagnosis not present

## 2019-08-01 DIAGNOSIS — F419 Anxiety disorder, unspecified: Secondary | ICD-10-CM | POA: Diagnosis not present

## 2019-08-01 DIAGNOSIS — R296 Repeated falls: Secondary | ICD-10-CM | POA: Diagnosis not present

## 2019-08-07 DIAGNOSIS — M1990 Unspecified osteoarthritis, unspecified site: Secondary | ICD-10-CM | POA: Diagnosis not present

## 2019-08-07 DIAGNOSIS — G20 Parkinson's disease: Secondary | ICD-10-CM | POA: Diagnosis not present

## 2019-08-07 DIAGNOSIS — H9190 Unspecified hearing loss, unspecified ear: Secondary | ICD-10-CM | POA: Diagnosis not present

## 2019-08-07 DIAGNOSIS — F419 Anxiety disorder, unspecified: Secondary | ICD-10-CM | POA: Diagnosis not present

## 2019-08-07 DIAGNOSIS — R296 Repeated falls: Secondary | ICD-10-CM | POA: Diagnosis not present

## 2019-08-07 DIAGNOSIS — G894 Chronic pain syndrome: Secondary | ICD-10-CM | POA: Diagnosis not present

## 2019-08-08 DIAGNOSIS — R296 Repeated falls: Secondary | ICD-10-CM | POA: Diagnosis not present

## 2019-08-08 DIAGNOSIS — H9190 Unspecified hearing loss, unspecified ear: Secondary | ICD-10-CM | POA: Diagnosis not present

## 2019-08-08 DIAGNOSIS — F419 Anxiety disorder, unspecified: Secondary | ICD-10-CM | POA: Diagnosis not present

## 2019-08-08 DIAGNOSIS — G894 Chronic pain syndrome: Secondary | ICD-10-CM | POA: Diagnosis not present

## 2019-08-08 DIAGNOSIS — M1990 Unspecified osteoarthritis, unspecified site: Secondary | ICD-10-CM | POA: Diagnosis not present

## 2019-08-08 DIAGNOSIS — G20 Parkinson's disease: Secondary | ICD-10-CM | POA: Diagnosis not present

## 2019-08-20 DIAGNOSIS — S82401A Unspecified fracture of shaft of right fibula, initial encounter for closed fracture: Secondary | ICD-10-CM | POA: Diagnosis not present

## 2019-08-20 DIAGNOSIS — Z20822 Contact with and (suspected) exposure to covid-19: Secondary | ICD-10-CM | POA: Diagnosis not present

## 2019-08-20 DIAGNOSIS — L02416 Cutaneous abscess of left lower limb: Secondary | ICD-10-CM | POA: Diagnosis not present

## 2019-08-20 DIAGNOSIS — G20 Parkinson's disease: Secondary | ICD-10-CM | POA: Diagnosis not present

## 2019-08-20 DIAGNOSIS — L98419 Non-pressure chronic ulcer of buttock with unspecified severity: Secondary | ICD-10-CM | POA: Diagnosis not present

## 2019-08-20 DIAGNOSIS — F419 Anxiety disorder, unspecified: Secondary | ICD-10-CM | POA: Diagnosis not present

## 2019-08-20 DIAGNOSIS — R2243 Localized swelling, mass and lump, lower limb, bilateral: Secondary | ICD-10-CM | POA: Diagnosis not present

## 2019-08-20 DIAGNOSIS — L03115 Cellulitis of right lower limb: Secondary | ICD-10-CM | POA: Diagnosis not present

## 2019-08-20 DIAGNOSIS — M7989 Other specified soft tissue disorders: Secondary | ICD-10-CM | POA: Diagnosis not present

## 2019-08-20 DIAGNOSIS — M19071 Primary osteoarthritis, right ankle and foot: Secondary | ICD-10-CM | POA: Diagnosis not present

## 2019-08-20 DIAGNOSIS — L02415 Cutaneous abscess of right lower limb: Secondary | ICD-10-CM | POA: Diagnosis not present

## 2019-08-21 DIAGNOSIS — R2243 Localized swelling, mass and lump, lower limb, bilateral: Secondary | ICD-10-CM | POA: Diagnosis not present

## 2019-08-21 DIAGNOSIS — F419 Anxiety disorder, unspecified: Secondary | ICD-10-CM | POA: Diagnosis not present

## 2019-08-21 DIAGNOSIS — L0390 Cellulitis, unspecified: Secondary | ICD-10-CM | POA: Diagnosis not present

## 2019-08-21 DIAGNOSIS — G20 Parkinson's disease: Secondary | ICD-10-CM | POA: Diagnosis not present

## 2019-08-22 ENCOUNTER — Other Ambulatory Visit: Payer: Self-pay | Admitting: Neurology

## 2019-08-22 DIAGNOSIS — R296 Repeated falls: Secondary | ICD-10-CM | POA: Diagnosis not present

## 2019-08-22 DIAGNOSIS — R609 Edema, unspecified: Secondary | ICD-10-CM | POA: Diagnosis not present

## 2019-08-22 DIAGNOSIS — L03115 Cellulitis of right lower limb: Secondary | ICD-10-CM | POA: Diagnosis not present

## 2019-08-22 DIAGNOSIS — G20 Parkinson's disease: Secondary | ICD-10-CM | POA: Diagnosis not present

## 2019-08-22 DIAGNOSIS — F411 Generalized anxiety disorder: Secondary | ICD-10-CM | POA: Diagnosis not present

## 2019-08-22 DIAGNOSIS — L89152 Pressure ulcer of sacral region, stage 2: Secondary | ICD-10-CM | POA: Diagnosis not present

## 2019-08-22 DIAGNOSIS — Z87440 Personal history of urinary (tract) infections: Secondary | ICD-10-CM | POA: Diagnosis not present

## 2019-08-23 DIAGNOSIS — L89152 Pressure ulcer of sacral region, stage 2: Secondary | ICD-10-CM | POA: Diagnosis not present

## 2019-08-23 DIAGNOSIS — L03115 Cellulitis of right lower limb: Secondary | ICD-10-CM | POA: Diagnosis not present

## 2019-08-23 DIAGNOSIS — G20 Parkinson's disease: Secondary | ICD-10-CM | POA: Diagnosis not present

## 2019-08-23 DIAGNOSIS — F411 Generalized anxiety disorder: Secondary | ICD-10-CM | POA: Diagnosis not present

## 2019-08-23 DIAGNOSIS — Z87440 Personal history of urinary (tract) infections: Secondary | ICD-10-CM | POA: Diagnosis not present

## 2019-08-23 DIAGNOSIS — R609 Edema, unspecified: Secondary | ICD-10-CM | POA: Diagnosis not present

## 2019-08-26 DIAGNOSIS — Z87440 Personal history of urinary (tract) infections: Secondary | ICD-10-CM | POA: Diagnosis not present

## 2019-08-26 DIAGNOSIS — R609 Edema, unspecified: Secondary | ICD-10-CM | POA: Diagnosis not present

## 2019-08-26 DIAGNOSIS — G20 Parkinson's disease: Secondary | ICD-10-CM | POA: Diagnosis not present

## 2019-08-26 DIAGNOSIS — L89152 Pressure ulcer of sacral region, stage 2: Secondary | ICD-10-CM | POA: Diagnosis not present

## 2019-08-26 DIAGNOSIS — L03115 Cellulitis of right lower limb: Secondary | ICD-10-CM | POA: Diagnosis not present

## 2019-08-26 DIAGNOSIS — F411 Generalized anxiety disorder: Secondary | ICD-10-CM | POA: Diagnosis not present

## 2019-08-27 DIAGNOSIS — L03115 Cellulitis of right lower limb: Secondary | ICD-10-CM | POA: Diagnosis not present

## 2019-08-27 DIAGNOSIS — L89152 Pressure ulcer of sacral region, stage 2: Secondary | ICD-10-CM | POA: Diagnosis not present

## 2019-08-27 DIAGNOSIS — G20 Parkinson's disease: Secondary | ICD-10-CM | POA: Diagnosis not present

## 2019-08-27 DIAGNOSIS — Z87440 Personal history of urinary (tract) infections: Secondary | ICD-10-CM | POA: Diagnosis not present

## 2019-08-27 DIAGNOSIS — F411 Generalized anxiety disorder: Secondary | ICD-10-CM | POA: Diagnosis not present

## 2019-08-27 DIAGNOSIS — R609 Edema, unspecified: Secondary | ICD-10-CM | POA: Diagnosis not present

## 2019-08-28 DIAGNOSIS — L03115 Cellulitis of right lower limb: Secondary | ICD-10-CM | POA: Diagnosis not present

## 2019-08-28 DIAGNOSIS — L89152 Pressure ulcer of sacral region, stage 2: Secondary | ICD-10-CM | POA: Diagnosis not present

## 2019-08-28 DIAGNOSIS — F411 Generalized anxiety disorder: Secondary | ICD-10-CM | POA: Diagnosis not present

## 2019-08-28 DIAGNOSIS — Z87440 Personal history of urinary (tract) infections: Secondary | ICD-10-CM | POA: Diagnosis not present

## 2019-08-28 DIAGNOSIS — R609 Edema, unspecified: Secondary | ICD-10-CM | POA: Diagnosis not present

## 2019-08-28 DIAGNOSIS — G20 Parkinson's disease: Secondary | ICD-10-CM | POA: Diagnosis not present

## 2019-08-29 DIAGNOSIS — G20 Parkinson's disease: Secondary | ICD-10-CM | POA: Diagnosis not present

## 2019-08-29 DIAGNOSIS — L89152 Pressure ulcer of sacral region, stage 2: Secondary | ICD-10-CM | POA: Diagnosis not present

## 2019-08-29 DIAGNOSIS — L03115 Cellulitis of right lower limb: Secondary | ICD-10-CM | POA: Diagnosis not present

## 2019-08-29 DIAGNOSIS — R609 Edema, unspecified: Secondary | ICD-10-CM | POA: Diagnosis not present

## 2019-08-29 DIAGNOSIS — Z87440 Personal history of urinary (tract) infections: Secondary | ICD-10-CM | POA: Diagnosis not present

## 2019-08-29 DIAGNOSIS — F411 Generalized anxiety disorder: Secondary | ICD-10-CM | POA: Diagnosis not present

## 2019-09-02 DIAGNOSIS — Z87440 Personal history of urinary (tract) infections: Secondary | ICD-10-CM | POA: Diagnosis not present

## 2019-09-02 DIAGNOSIS — L89152 Pressure ulcer of sacral region, stage 2: Secondary | ICD-10-CM | POA: Diagnosis not present

## 2019-09-02 DIAGNOSIS — L03115 Cellulitis of right lower limb: Secondary | ICD-10-CM | POA: Diagnosis not present

## 2019-09-02 DIAGNOSIS — F411 Generalized anxiety disorder: Secondary | ICD-10-CM | POA: Diagnosis not present

## 2019-09-02 DIAGNOSIS — R609 Edema, unspecified: Secondary | ICD-10-CM | POA: Diagnosis not present

## 2019-09-02 DIAGNOSIS — G20 Parkinson's disease: Secondary | ICD-10-CM | POA: Diagnosis not present

## 2019-09-03 DIAGNOSIS — R609 Edema, unspecified: Secondary | ICD-10-CM | POA: Diagnosis not present

## 2019-09-03 DIAGNOSIS — L89152 Pressure ulcer of sacral region, stage 2: Secondary | ICD-10-CM | POA: Diagnosis not present

## 2019-09-03 DIAGNOSIS — Z87440 Personal history of urinary (tract) infections: Secondary | ICD-10-CM | POA: Diagnosis not present

## 2019-09-03 DIAGNOSIS — L03115 Cellulitis of right lower limb: Secondary | ICD-10-CM | POA: Diagnosis not present

## 2019-09-03 DIAGNOSIS — F411 Generalized anxiety disorder: Secondary | ICD-10-CM | POA: Diagnosis not present

## 2019-09-03 DIAGNOSIS — G20 Parkinson's disease: Secondary | ICD-10-CM | POA: Diagnosis not present

## 2019-09-05 DIAGNOSIS — L03115 Cellulitis of right lower limb: Secondary | ICD-10-CM | POA: Diagnosis not present

## 2019-09-05 DIAGNOSIS — R609 Edema, unspecified: Secondary | ICD-10-CM | POA: Diagnosis not present

## 2019-09-05 DIAGNOSIS — Z87440 Personal history of urinary (tract) infections: Secondary | ICD-10-CM | POA: Diagnosis not present

## 2019-09-05 DIAGNOSIS — G20 Parkinson's disease: Secondary | ICD-10-CM | POA: Diagnosis not present

## 2019-09-05 DIAGNOSIS — F411 Generalized anxiety disorder: Secondary | ICD-10-CM | POA: Diagnosis not present

## 2019-09-05 DIAGNOSIS — L89152 Pressure ulcer of sacral region, stage 2: Secondary | ICD-10-CM | POA: Diagnosis not present

## 2019-09-06 DIAGNOSIS — G20 Parkinson's disease: Secondary | ICD-10-CM | POA: Diagnosis not present

## 2019-09-06 DIAGNOSIS — F411 Generalized anxiety disorder: Secondary | ICD-10-CM | POA: Diagnosis not present

## 2019-09-06 DIAGNOSIS — L89152 Pressure ulcer of sacral region, stage 2: Secondary | ICD-10-CM | POA: Diagnosis not present

## 2019-09-06 DIAGNOSIS — L03115 Cellulitis of right lower limb: Secondary | ICD-10-CM | POA: Diagnosis not present

## 2019-09-06 DIAGNOSIS — Z87440 Personal history of urinary (tract) infections: Secondary | ICD-10-CM | POA: Diagnosis not present

## 2019-09-06 DIAGNOSIS — R609 Edema, unspecified: Secondary | ICD-10-CM | POA: Diagnosis not present

## 2019-09-09 DIAGNOSIS — L89152 Pressure ulcer of sacral region, stage 2: Secondary | ICD-10-CM | POA: Diagnosis not present

## 2019-09-09 DIAGNOSIS — Z87440 Personal history of urinary (tract) infections: Secondary | ICD-10-CM | POA: Diagnosis not present

## 2019-09-09 DIAGNOSIS — F411 Generalized anxiety disorder: Secondary | ICD-10-CM | POA: Diagnosis not present

## 2019-09-09 DIAGNOSIS — G20 Parkinson's disease: Secondary | ICD-10-CM | POA: Diagnosis not present

## 2019-09-09 DIAGNOSIS — R609 Edema, unspecified: Secondary | ICD-10-CM | POA: Diagnosis not present

## 2019-09-09 DIAGNOSIS — L03115 Cellulitis of right lower limb: Secondary | ICD-10-CM | POA: Diagnosis not present

## 2019-09-10 DIAGNOSIS — L03115 Cellulitis of right lower limb: Secondary | ICD-10-CM | POA: Diagnosis not present

## 2019-09-10 DIAGNOSIS — L89152 Pressure ulcer of sacral region, stage 2: Secondary | ICD-10-CM | POA: Diagnosis not present

## 2019-09-10 DIAGNOSIS — G20 Parkinson's disease: Secondary | ICD-10-CM | POA: Diagnosis not present

## 2019-09-10 DIAGNOSIS — F411 Generalized anxiety disorder: Secondary | ICD-10-CM | POA: Diagnosis not present

## 2019-09-10 DIAGNOSIS — R609 Edema, unspecified: Secondary | ICD-10-CM | POA: Diagnosis not present

## 2019-09-10 DIAGNOSIS — Z87440 Personal history of urinary (tract) infections: Secondary | ICD-10-CM | POA: Diagnosis not present

## 2019-09-12 DIAGNOSIS — L03115 Cellulitis of right lower limb: Secondary | ICD-10-CM | POA: Diagnosis not present

## 2019-09-12 DIAGNOSIS — K5900 Constipation, unspecified: Secondary | ICD-10-CM | POA: Diagnosis not present

## 2019-09-12 DIAGNOSIS — L89152 Pressure ulcer of sacral region, stage 2: Secondary | ICD-10-CM | POA: Diagnosis not present

## 2019-09-12 DIAGNOSIS — R609 Edema, unspecified: Secondary | ICD-10-CM | POA: Diagnosis not present

## 2019-09-12 DIAGNOSIS — G20 Parkinson's disease: Secondary | ICD-10-CM | POA: Diagnosis not present

## 2019-09-12 DIAGNOSIS — Z87440 Personal history of urinary (tract) infections: Secondary | ICD-10-CM | POA: Diagnosis not present

## 2019-09-12 DIAGNOSIS — K5641 Fecal impaction: Secondary | ICD-10-CM | POA: Diagnosis not present

## 2019-09-12 DIAGNOSIS — F411 Generalized anxiety disorder: Secondary | ICD-10-CM | POA: Diagnosis not present

## 2019-09-17 DIAGNOSIS — L89152 Pressure ulcer of sacral region, stage 2: Secondary | ICD-10-CM | POA: Diagnosis not present

## 2019-09-17 DIAGNOSIS — G20 Parkinson's disease: Secondary | ICD-10-CM | POA: Diagnosis not present

## 2019-09-17 DIAGNOSIS — Z87440 Personal history of urinary (tract) infections: Secondary | ICD-10-CM | POA: Diagnosis not present

## 2019-09-17 DIAGNOSIS — F411 Generalized anxiety disorder: Secondary | ICD-10-CM | POA: Diagnosis not present

## 2019-09-17 DIAGNOSIS — R609 Edema, unspecified: Secondary | ICD-10-CM | POA: Diagnosis not present

## 2019-09-17 DIAGNOSIS — L03115 Cellulitis of right lower limb: Secondary | ICD-10-CM | POA: Diagnosis not present

## 2019-09-19 DIAGNOSIS — L03115 Cellulitis of right lower limb: Secondary | ICD-10-CM | POA: Diagnosis not present

## 2019-09-19 DIAGNOSIS — Z20822 Contact with and (suspected) exposure to covid-19: Secondary | ICD-10-CM | POA: Diagnosis present

## 2019-09-19 DIAGNOSIS — R05 Cough: Secondary | ICD-10-CM | POA: Diagnosis not present

## 2019-09-19 DIAGNOSIS — R109 Unspecified abdominal pain: Secondary | ICD-10-CM | POA: Diagnosis not present

## 2019-09-19 DIAGNOSIS — K5900 Constipation, unspecified: Secondary | ICD-10-CM | POA: Diagnosis not present

## 2019-09-19 DIAGNOSIS — G20 Parkinson's disease: Secondary | ICD-10-CM | POA: Diagnosis not present

## 2019-09-19 DIAGNOSIS — J9811 Atelectasis: Secondary | ICD-10-CM | POA: Diagnosis not present

## 2019-09-19 DIAGNOSIS — M62838 Other muscle spasm: Secondary | ICD-10-CM | POA: Diagnosis present

## 2019-09-19 DIAGNOSIS — B965 Pseudomonas (aeruginosa) (mallei) (pseudomallei) as the cause of diseases classified elsewhere: Secondary | ICD-10-CM | POA: Diagnosis present

## 2019-09-19 DIAGNOSIS — K5641 Fecal impaction: Secondary | ICD-10-CM | POA: Diagnosis present

## 2019-09-19 DIAGNOSIS — I700 Atherosclerosis of aorta: Secondary | ICD-10-CM | POA: Diagnosis not present

## 2019-09-19 DIAGNOSIS — R918 Other nonspecific abnormal finding of lung field: Secondary | ICD-10-CM | POA: Diagnosis not present

## 2019-09-19 DIAGNOSIS — Z66 Do not resuscitate: Secondary | ICD-10-CM | POA: Diagnosis not present

## 2019-09-19 DIAGNOSIS — F419 Anxiety disorder, unspecified: Secondary | ICD-10-CM | POA: Diagnosis present

## 2019-09-19 DIAGNOSIS — N390 Urinary tract infection, site not specified: Secondary | ICD-10-CM | POA: Diagnosis not present

## 2019-09-24 DIAGNOSIS — G20 Parkinson's disease: Secondary | ICD-10-CM | POA: Diagnosis not present

## 2019-09-24 DIAGNOSIS — K5900 Constipation, unspecified: Secondary | ICD-10-CM | POA: Diagnosis not present

## 2019-09-24 DIAGNOSIS — N3000 Acute cystitis without hematuria: Secondary | ICD-10-CM | POA: Diagnosis not present

## 2019-09-25 DIAGNOSIS — G20 Parkinson's disease: Secondary | ICD-10-CM | POA: Diagnosis not present

## 2019-10-01 DIAGNOSIS — Z87440 Personal history of urinary (tract) infections: Secondary | ICD-10-CM | POA: Diagnosis not present

## 2019-10-01 DIAGNOSIS — R350 Frequency of micturition: Secondary | ICD-10-CM | POA: Diagnosis not present

## 2019-10-01 DIAGNOSIS — N319 Neuromuscular dysfunction of bladder, unspecified: Secondary | ICD-10-CM | POA: Diagnosis not present

## 2019-10-01 DIAGNOSIS — N3941 Urge incontinence: Secondary | ICD-10-CM | POA: Diagnosis not present

## 2019-10-15 ENCOUNTER — Other Ambulatory Visit: Payer: Self-pay | Admitting: Neurology

## 2019-11-06 ENCOUNTER — Other Ambulatory Visit: Payer: Self-pay | Admitting: Neurology

## 2019-11-18 ENCOUNTER — Other Ambulatory Visit: Payer: Self-pay | Admitting: Neurology

## 2019-12-26 ENCOUNTER — Other Ambulatory Visit: Payer: Self-pay | Admitting: Neurology

## 2019-12-26 DIAGNOSIS — F909 Attention-deficit hyperactivity disorder, unspecified type: Secondary | ICD-10-CM

## 2019-12-26 DIAGNOSIS — G20 Parkinson's disease: Secondary | ICD-10-CM

## 2019-12-26 DIAGNOSIS — R2689 Other abnormalities of gait and mobility: Secondary | ICD-10-CM
